# Patient Record
Sex: Male | Born: 1950 | Race: White | Hispanic: Yes | Marital: Married | Smoking: Former smoker
Health system: Southern US, Community
[De-identification: ages and names within clinical notes are randomized; demographics above are authoritative.]

---

## 2011-04-02 ENCOUNTER — Ambulatory Visit: Payer: Self-pay

## 2011-07-02 ENCOUNTER — Ambulatory Visit (INDEPENDENT_AMBULATORY_CARE_PROVIDER_SITE_OTHER): Payer: BC Managed Care – PPO | Admitting: Family Medicine

## 2011-07-02 VITALS — BP 131/85 | HR 59 | Temp 98.1°F | Resp 16 | Ht 67.5 in | Wt 204.8 lb

## 2011-07-02 DIAGNOSIS — Z0289 Encounter for other administrative examinations: Secondary | ICD-10-CM

## 2011-07-02 NOTE — Progress Notes (Signed)
  Subjective:    Patient ID: Isaiah Stevenson, male    DOB: 1950-07-02, 61 y.o.   MRN: [id number]  HPI 61 yo male with HTN here for DOT exam.  Last here 04/02/11 for DOT.  Only give 3 month card due to HTN.  Since that time, saw PCP who increased his metoprolol to twice daily.   Doing well otherwise.    Review of Systems Negative except as per HPI     Objective:   Physical Exam  Constitutional: Vital signs are normal. He appears well-developed and well-nourished. He is active.  HENT:  Head: Normocephalic.  Right Ear: Hearing, tympanic membrane, external ear and ear canal normal.  Left Ear: Hearing, tympanic membrane, external ear and ear canal normal.  Nose: Nose normal.  Mouth/Throat: Uvula is midline, oropharynx is clear and moist and mucous membranes are normal.  Eyes: Conjunctivae and EOM are normal. Pupils are equal, round, and reactive to light.  Neck: No mass and no thyromegaly present.  Cardiovascular: Normal rate, regular rhythm, normal heart sounds, intact distal pulses and normal pulses.   Pulmonary/Chest: Effort normal and breath sounds normal.  Abdominal: Soft. Normal appearance and bowel sounds are normal. There is no hepatosplenomegaly. There is no tenderness. There is no CVA tenderness. No hernia. Hernia confirmed negative in the right inguinal area and confirmed negative in the left inguinal area.  Genitourinary: Testes normal and penis normal.  Lymphadenopathy:    He has no cervical adenopathy.    He has no axillary adenopathy.  Neurological: He is alert.          Assessment & Plan:  1 yr DOT due to HTN

## 2011-10-09 ENCOUNTER — Ambulatory Visit (INDEPENDENT_AMBULATORY_CARE_PROVIDER_SITE_OTHER): Payer: BC Managed Care – PPO | Admitting: Family Medicine

## 2011-10-09 VITALS — BP 120/70 | HR 77 | Temp 98.2°F | Resp 16 | Ht 68.0 in | Wt 210.2 lb

## 2011-10-09 DIAGNOSIS — H9190 Unspecified hearing loss, unspecified ear: Secondary | ICD-10-CM

## 2011-10-09 NOTE — Progress Notes (Signed)
[facility] [address], [place] [address] [phone]  Date:  10/09/2011   Name:  Isaiah Stevenson   DOB:  January 10, 1951   MRN:  [id number]  PCP:  No primary provider on file.    Chief Complaint: Hearing Problem   History of Present Illness:  Isaiah Stevenson is a 61 y.o. very pleasant male patient who presents with the following:  He needs a hearing test today- he needs to get new hearing aids.  He needs an audiogram.  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 He has a current set of aids but needs new ones. He is able to send his audiogram results to a company in [place] and they send him hearing aids at an affordable price.    There is no problem list on file for this patient.   No past medical history on file.  No past surgical history on file.  History  Substance Use Topics  . Smoking status: Former Smoker    Quit date: 07/01/2008  . Smokeless tobacco: Not on file  . Alcohol Use: Not on file    No family history on file.  No Known Allergies  Medication list has been reviewed and updated.  Current Outpatient Prescriptions on File Prior to Visit  Medication Sig Dispense Refill  . fenofibrate micronized (LOFIBRA) 200 MG capsule Take 200 mg by mouth daily before breakfast.      . fexofenadine (ALLEGRA) 180 MG tablet Take 180 mg by mouth daily.      . metoprolol (LOPRESSOR) 100 MG tablet Take 100 mg by mouth 2 (two) times daily.      . Multiple Vitamin (MULTIVITAMIN) tablet Take 1 tablet by mouth daily.      Marland Kitchen omeprazole (PRILOSEC) 40 MG capsule Take 40 mg by mouth daily.      . pravastatin (PRAVACHOL) 40 MG tablet Take 40 mg by mouth daily.        Review of Systems:  As per HPI- otherwise negative.  Physical Examination: Filed Vitals:   10/09/11 0841  BP: 120/70  Pulse: 77  Temp: 98.2 F (36.8 C)  Resp: 16   Filed Vitals:   10/09/11 0841  Height: 5\' 8"  (1.727 m)  Weight: 210 lb 3.2 oz (95.346 kg)   Body mass index is  31.96 kg/(m^2). Ideal Body Weight: Weight in (lb) to have BMI = 25: 164.1   GEN: WDWN, NAD, Non-toxic, A & O x 3, overweight HEENT: Atraumatic, Normocephalic. Neck supple. No masses, No LAD.  Tm and oropharynx wnl, PEERL Ears and Nose: No external deformity. CV: RRR, No M/G/R. No JVD. No thrill. No extra heart sounds. PULM: CTA B, no wheezes, crackles, rhonchi. No retractions. No resp. distress. No accessory muscle use. EXTR: No c/c/e NEURO Normal gait.  PSYCH: Normally interactive. Conversant. Not depressed or anxious appearing.  Calm demeanor.  Audiometry: moderate to moderately severe bilateral hearing loss  Assessment and Plan: 1. Hearing loss  PR COMPREHENSIVE HEARING TEST   Did audiogram and gave him results so he can update his hearing aids.  He will let us know if we can do anything else to help.    Abbe Amsterdam, MD

## 2017-12-24 ENCOUNTER — Inpatient Hospital Stay
Admit: 2017-12-24 | Discharge: 2018-01-21 | Disposition: E | Payer: Self-pay | Source: Ambulatory Visit | Attending: Internal Medicine | Admitting: Internal Medicine

## 2017-12-24 DIAGNOSIS — R17 Unspecified jaundice: Secondary | ICD-10-CM

## 2017-12-24 DIAGNOSIS — R0902 Hypoxemia: Secondary | ICD-10-CM

## 2017-12-24 DIAGNOSIS — Z4659 Encounter for fitting and adjustment of other gastrointestinal appliance and device: Secondary | ICD-10-CM

## 2017-12-24 DIAGNOSIS — I509 Heart failure, unspecified: Secondary | ICD-10-CM

## 2017-12-24 DIAGNOSIS — J189 Pneumonia, unspecified organism: Secondary | ICD-10-CM

## 2017-12-24 LAB — CREATININE, SERUM
CREATININE: 2.89 mg/dL — AB (ref 0.61–1.24)
GFR calc Af Amer: 25 mL/min — ABNORMAL LOW (ref >60)
GFR, EST NON AFRICAN AMERICAN: 21 mL/min — AB (ref >60)

## 2017-12-24 LAB — CBC WITH DIFFERENTIAL/PLATELET
Basophils Absolute: 0 10*3/uL (ref 0.0–0.1)
Basophils Relative: 0 %
EOS ABS: 0.1 10*3/uL (ref 0.0–0.7)
EOS PCT: 1 %
HCT: 26.2 % — ABNORMAL LOW (ref 39.0–52.0)
Hemoglobin: 8 g/dL — ABNORMAL LOW (ref 13.0–17.0)
LYMPHS ABS: 1.3 10*3/uL (ref 0.7–4.0)
Lymphocytes Relative: 9 %
MCH: 34.5 pg — AB (ref 26.0–34.0)
MCHC: 30.5 g/dL (ref 30.0–36.0)
MCV: 112.9 fL — AB (ref 78.0–100.0)
MONO ABS: 1 10*3/uL (ref 0.1–1.0)
Monocytes Relative: 7 %
Neutro Abs: 12.2 10*3/uL — ABNORMAL HIGH (ref 1.7–7.7)
Neutrophils Relative %: 83 %
PLATELETS: UNDETERMINED 10*3/uL (ref 150–400)
RBC: 2.32 MIL/uL — AB (ref 4.22–5.81)
RDW: 24.5 % — AB (ref 11.5–15.5)
WBC: 14.6 10*3/uL — AB (ref 4.0–10.5)

## 2017-12-24 LAB — PROTIME-INR
INR: 4.28
Prothrombin Time: 40.8 seconds — ABNORMAL HIGH (ref 11.4–15.2)

## 2017-12-24 LAB — APTT: APTT: 85 s — AB (ref 24–36)

## 2017-12-25 ENCOUNTER — Other Ambulatory Visit (HOSPITAL_COMMUNITY): Payer: Self-pay

## 2017-12-25 LAB — CBC WITH DIFFERENTIAL/PLATELET
BASOS PCT: 0 %
Basophils Absolute: 0 10*3/uL (ref 0.0–0.1)
EOS ABS: 0.2 10*3/uL (ref 0.0–0.7)
Eosinophils Relative: 1 %
HCT: 26.3 % — ABNORMAL LOW (ref 39.0–52.0)
Hemoglobin: 7.9 g/dL — ABNORMAL LOW (ref 13.0–17.0)
Lymphocytes Relative: 9 %
Lymphs Abs: 1.4 10*3/uL (ref 0.7–4.0)
MCH: 34.5 pg — AB (ref 26.0–34.0)
MCHC: 30 g/dL (ref 30.0–36.0)
MCV: 114.8 fL — ABNORMAL HIGH (ref 78.0–100.0)
MONO ABS: 1.4 10*3/uL — AB (ref 0.1–1.0)
Monocytes Relative: 9 %
NEUTROS PCT: 81 %
Neutro Abs: 13.1 10*3/uL — ABNORMAL HIGH (ref 1.7–7.7)
PLATELETS: 132 10*3/uL — AB (ref 150–400)
RBC: 2.29 MIL/uL — ABNORMAL LOW (ref 4.22–5.81)
RDW: 23.5 % — ABNORMAL HIGH (ref 11.5–15.5)
WBC: 16.1 10*3/uL — ABNORMAL HIGH (ref 4.0–10.5)

## 2017-12-25 LAB — COMPREHENSIVE METABOLIC PANEL
ALBUMIN: 2 g/dL — AB (ref 3.5–5.0)
ALT: 23 U/L (ref 0–44)
AST: 174 U/L — ABNORMAL HIGH (ref 15–41)
Alkaline Phosphatase: 107 U/L (ref 38–126)
Anion gap: 14 (ref 5–15)
BUN: 43 mg/dL — ABNORMAL HIGH (ref 8–23)
CHLORIDE: 96 mmol/L — AB (ref 98–111)
CO2: 20 mmol/L — ABNORMAL LOW (ref 22–32)
Calcium: 7.9 mg/dL — ABNORMAL LOW (ref 8.9–10.3)
Creatinine, Ser: 3.54 mg/dL — ABNORMAL HIGH (ref 0.61–1.24)
GFR calc Af Amer: 19 mL/min — ABNORMAL LOW (ref >60)
GFR calc non Af Amer: 17 mL/min — ABNORMAL LOW (ref >60)
GLUCOSE: 108 mg/dL — AB (ref 70–99)
POTASSIUM: 4 mmol/L (ref 3.5–5.1)
Sodium: 130 mmol/L — ABNORMAL LOW (ref 135–145)
TOTAL PROTEIN: 4.9 g/dL — AB (ref 6.5–8.1)
Total Bilirubin: 7.5 mg/dL — ABNORMAL HIGH (ref 0.3–1.2)

## 2017-12-25 LAB — APTT
APTT: 111 s — AB (ref 24–36)
APTT: 101 s — AB (ref 24–36)
aPTT: 110 seconds — ABNORMAL HIGH (ref 24–36)
aPTT: 97 seconds — ABNORMAL HIGH (ref 24–36)

## 2017-12-25 LAB — PROTIME-INR
INR: 7.22 — AB
INR: 7.43 — AB
PROTHROMBIN TIME: 61.4 s — AB (ref 11.4–15.2)
Prothrombin Time: 62.7 seconds — ABNORMAL HIGH (ref 11.4–15.2)

## 2017-12-26 LAB — APTT
APTT: 99 s — AB (ref 24–36)
APTT: 104 s — AB (ref 24–36)
aPTT: 96 seconds — ABNORMAL HIGH (ref 24–36)

## 2017-12-26 LAB — CBC WITH DIFFERENTIAL/PLATELET
BASOS ABS: 0 10*3/uL (ref 0.0–0.1)
Basophils Relative: 0 %
EOS ABS: 0.4 10*3/uL (ref 0.0–0.7)
Eosinophils Relative: 2 %
HCT: 26.2 % — ABNORMAL LOW (ref 39.0–52.0)
Hemoglobin: 7.8 g/dL — ABNORMAL LOW (ref 13.0–17.0)
Lymphocytes Relative: 11 %
Lymphs Abs: 2 10*3/uL (ref 0.7–4.0)
MCH: 33.1 pg (ref 26.0–34.0)
MCHC: 29.8 g/dL — AB (ref 30.0–36.0)
MCV: 111 fL — ABNORMAL HIGH (ref 78.0–100.0)
Monocytes Absolute: 1.7 10*3/uL — ABNORMAL HIGH (ref 0.1–1.0)
Monocytes Relative: 9 %
NEUTROS ABS: 14.3 10*3/uL — AB (ref 1.7–7.7)
Neutrophils Relative %: 78 %
Platelets: 156 10*3/uL (ref 150–400)
RBC: 2.36 MIL/uL — ABNORMAL LOW (ref 4.22–5.81)
RDW: 23.9 % — ABNORMAL HIGH (ref 11.5–15.5)
WBC: 18.4 10*3/uL — ABNORMAL HIGH (ref 4.0–10.5)

## 2017-12-26 LAB — PROTIME-INR
INR: 5.22
Prothrombin Time: 47.6 seconds — ABNORMAL HIGH (ref 11.4–15.2)

## 2017-12-27 ENCOUNTER — Other Ambulatory Visit (HOSPITAL_COMMUNITY): Payer: Self-pay

## 2017-12-27 LAB — CBC WITH DIFFERENTIAL/PLATELET
BASOS PCT: 1 %
Basophils Absolute: 0.2 10*3/uL — ABNORMAL HIGH (ref 0.0–0.1)
EOS ABS: 0.2 10*3/uL (ref 0.0–0.7)
EOS PCT: 1 %
HEMATOCRIT: 26.5 % — AB (ref 39.0–52.0)
HEMOGLOBIN: 8.1 g/dL — AB (ref 13.0–17.0)
LYMPHS PCT: 7 %
Lymphs Abs: 1.4 10*3/uL (ref 0.7–4.0)
MCH: 33.9 pg (ref 26.0–34.0)
MCHC: 30.6 g/dL (ref 30.0–36.0)
MCV: 110.9 fL — AB (ref 78.0–100.0)
Monocytes Absolute: 0.8 10*3/uL (ref 0.1–1.0)
Monocytes Relative: 4 %
NEUTROS PCT: 87 %
Neutro Abs: 17 10*3/uL — ABNORMAL HIGH (ref 1.7–7.7)
Platelets: 159 10*3/uL (ref 150–400)
RBC: 2.39 MIL/uL — ABNORMAL LOW (ref 4.22–5.81)
RDW: 24.5 % — ABNORMAL HIGH (ref 11.5–15.5)
WBC: 19.6 10*3/uL — ABNORMAL HIGH (ref 4.0–10.5)

## 2017-12-27 LAB — PROTIME-INR
INR: 5.68 — AB
Prothrombin Time: 50.9 seconds — ABNORMAL HIGH (ref 11.4–15.2)

## 2017-12-27 LAB — RENAL FUNCTION PANEL
Albumin: 2 g/dL — ABNORMAL LOW (ref 3.5–5.0)
Anion gap: 18 — ABNORMAL HIGH (ref 5–15)
BUN: 71 mg/dL — AB (ref 8–23)
CHLORIDE: 94 mmol/L — AB (ref 98–111)
CO2: 16 mmol/L — ABNORMAL LOW (ref 22–32)
CREATININE: 4.99 mg/dL — AB (ref 0.61–1.24)
Calcium: 8.3 mg/dL — ABNORMAL LOW (ref 8.9–10.3)
GFR calc Af Amer: 13 mL/min — ABNORMAL LOW (ref >60)
GFR, EST NON AFRICAN AMERICAN: 11 mL/min — AB (ref >60)
Glucose, Bld: 75 mg/dL (ref 70–99)
Phosphorus: 9.2 mg/dL — ABNORMAL HIGH (ref 2.5–4.6)
Potassium: 5.1 mmol/L (ref 3.5–5.1)
Sodium: 128 mmol/L — ABNORMAL LOW (ref 135–145)

## 2017-12-27 LAB — APTT
APTT: 86 s — AB (ref 24–36)
APTT: 88 s — AB (ref 24–36)
APTT: 97 s — AB (ref 24–36)
aPTT: 105 seconds — ABNORMAL HIGH (ref 24–36)

## 2017-12-27 LAB — HEPATIC FUNCTION PANEL
ALK PHOS: 118 U/L (ref 38–126)
ALT: 26 U/L (ref 0–44)
AST: 289 U/L — ABNORMAL HIGH (ref 15–41)
Albumin: 2.1 g/dL — ABNORMAL LOW (ref 3.5–5.0)
BILIRUBIN INDIRECT: 4.7 mg/dL — AB (ref 0.3–0.9)
BILIRUBIN TOTAL: 10.7 mg/dL — AB (ref 0.3–1.2)
Bilirubin, Direct: 6 mg/dL — ABNORMAL HIGH (ref 0.0–0.2)
TOTAL PROTEIN: 5.1 g/dL — AB (ref 6.5–8.1)

## 2017-12-27 LAB — CBC
HCT: 23.4 % — ABNORMAL LOW (ref 39.0–52.0)
Hemoglobin: 7.3 g/dL — ABNORMAL LOW (ref 13.0–17.0)
MCH: 34 pg (ref 26.0–34.0)
MCHC: 31.2 g/dL (ref 30.0–36.0)
MCV: 108.8 fL — AB (ref 78.0–100.0)
PLATELETS: 262 10*3/uL (ref 150–400)
RBC: 2.15 MIL/uL — ABNORMAL LOW (ref 4.22–5.81)
RDW: 23.9 % — AB (ref 11.5–15.5)
WBC: 20.2 10*3/uL — AB (ref 4.0–10.5)

## 2017-12-27 LAB — AMYLASE: AMYLASE: 501 U/L — AB (ref 28–100)

## 2017-12-27 LAB — LIPASE, BLOOD: Lipase: 1256 U/L — ABNORMAL HIGH (ref 11–51)

## 2017-12-27 MED ORDER — WARFARIN SODIUM 3 MG PO TABS
3.00 | ORAL_TABLET | ORAL | Status: DC
Start: 2017-12-25 — End: 2017-12-27

## 2017-12-27 MED ORDER — OXYCODONE HCL 5 MG PO TABS
2.50 | ORAL_TABLET | ORAL | Status: DC
Start: ? — End: 2017-12-27

## 2017-12-27 MED ORDER — ASPIRIN 81 MG PO CHEW
81.00 | CHEWABLE_TABLET | ORAL | Status: DC
Start: 2017-12-25 — End: 2017-12-27

## 2017-12-27 MED ORDER — SODIUM CITRATE 4 % VI SOSY
5.00 | PREFILLED_SYRINGE | Status: DC
Start: ? — End: 2017-12-27

## 2017-12-27 MED ORDER — GENERIC EXTERNAL MEDICATION
0.05 | Status: DC
Start: ? — End: 2017-12-27

## 2017-12-27 MED ORDER — NEPHRO-VITE 0.8 MG PO TABS
1.00 | ORAL_TABLET | ORAL | Status: DC
Start: 2017-12-25 — End: 2017-12-27

## 2017-12-27 MED ORDER — OXIDIZED CELLULOSE EX PADS
1.00 | MEDICATED_PAD | CUTANEOUS | Status: DC
Start: ? — End: 2017-12-27

## 2017-12-27 MED ORDER — LORAZEPAM 0.5 MG PO TABS
0.25 | ORAL_TABLET | ORAL | Status: DC
Start: ? — End: 2017-12-27

## 2017-12-27 MED ORDER — GENERIC EXTERNAL MEDICATION
80.00 | Status: DC
Start: ? — End: 2017-12-27

## 2017-12-27 MED ORDER — THIAMINE HCL 100 MG PO TABS
100.00 | ORAL_TABLET | ORAL | Status: DC
Start: 2017-12-25 — End: 2017-12-27

## 2017-12-27 MED ORDER — ONDANSETRON HCL 4 MG/2ML IJ SOLN
4.00 | INTRAMUSCULAR | Status: DC
Start: ? — End: 2017-12-27

## 2017-12-27 MED ORDER — DRONABINOL 2.5 MG PO CAPS
10.00 | ORAL_CAPSULE | ORAL | Status: DC
Start: 2017-12-24 — End: 2017-12-27

## 2017-12-27 MED ORDER — FOLIC ACID 1 MG PO TABS
5.00 | ORAL_TABLET | ORAL | Status: DC
Start: 2017-12-25 — End: 2017-12-27

## 2017-12-27 MED ORDER — PANTOPRAZOLE SODIUM 40 MG PO TBEC
40.00 | DELAYED_RELEASE_TABLET | ORAL | Status: DC
Start: 2017-12-25 — End: 2017-12-27

## 2017-12-27 NOTE — Consult Note (Signed)
[facility] CONSULT NOTE    Date: 12/27/2017                  Patient Name:  Isaiah Stevenson  MRN: [id number]  DOB: 1950/04/05  Age / Sex: 67 y.o., male         PCP: Regino Bellow, MD                 Service Requesting Consult: Hospitalist                 Reason for Consult: Management of Acute renal failure requiring dialysis            History of Present Illness: Patient is a 67 y.o. male with a PMHx of coronary artery disease status post CABG, VSD repair x2 most recently on December 02, 2017, heparin-induced thrombocytopenia, alcohol abuse, acute kidney injury requiring dialysis, hyperlipidemia, who was admitted to [facility] on 01/02/2018 for ongoing rehabilitation.  Patient originally presented to [facility] with ST elevation myocardial infarction and underwent CABG x1 with VSD repair on August 12.  He was then discharged but came back with worsening dyspnea and edema.  Repeat TEE demonstrated persistent VSD.  He then had VSD closure on December 02, 2017.  He developed transaminitis as well as acute kidney injury.  His baseline creatinine is 1.0.  He has a right internal jugular PermCath in place.  Initially he was on CRRT and then transition to intermittent hemodialysis late in the course of his prior hospitalization.  He remains dialysis dependent at the moment.  BUN is currently 43 with a creatinine of 3.54.  He is also anemic with a hemoglobin of 8.1.  Wife is at the bedside and offered history.   Medications: Outpatient medications: Medications Prior to Admission  Medication Sig Dispense Refill Last Dose  . fenofibrate micronized (LOFIBRA) 200 MG capsule Take 200 mg by mouth daily before breakfast.   Taking  . fexofenadine (ALLEGRA) 180 MG tablet Take 180 mg by mouth daily.   Taking  . metoprolol (LOPRESSOR) 100 MG tablet Take 100 mg by mouth 2 (two) times daily.   Taking  . Multiple Vitamin (MULTIVITAMIN) tablet Take 1  tablet by mouth daily.   Taking  . omeprazole (PRILOSEC) 40 MG capsule Take 40 mg by mouth daily.   Taking  . pravastatin (PRAVACHOL) 40 MG tablet Take 40 mg by mouth daily.   Taking    Current medications: No current facility-administered medications for this encounter.       Allergies: No Known Allergies    Past Medical History: coronary artery disease status post CABG, VSD repair x2 most recently on December 04, 2017, heparin-induced thrombocytopenia, alcohol abuse, acute kidney injury requiring dialysis, hyperlipidemia  Past Surgical History: CABG times 18/12/19, VSD repair x 2 Permcath placement. Appendectomy Tonsillectomy  Family History: Father had history of prostate cancer  Social History: Social History   Socioeconomic History  . Marital status: Married    Spouse name: Not on file  . Number of children: Not on file  . Years of education: Not on file  . Highest education level: Not on file  Occupational History  . Not on file  Social Needs  . Financial resource strain: Not on file  . Food insecurity:    Worry: Not on file    Inability: Not on file  . Transportation needs:    Medical: Not on file    Non-medical: Not on file  Tobacco Use  . Smoking status: Former Smoker    Last attempt to quit: 07/01/2008    Years since quitting: 9.4  Substance and Sexual Activity  . Alcohol use: Not on file  . Drug use: Not on file  . Sexual activity: Not on file  Lifestyle  . Physical activity:    Days per week: Not on file    Minutes per session: Not on file  . Stress: Not on file  Relationships  . Social connections:    Talks on phone: Not on file    Gets together: Not on file    Attends religious service: Not on file    Active member of club or organization: Not on file    Attends meetings of clubs or organizations: Not on file    Relationship status: Not on file  . Intimate partner violence:    Fear of current or ex partner: Not on file    Emotionally  abused: Not on file    Physically abused: Not on file    Forced sexual activity: Not on file  Other Topics Concern  . Not on file  Social History Narrative  . Not on file     Review of Systems: Patient unable to concentrate on ROS questions  Vital Signs: Temperature 97.8 pulse 107 respirations 18 blood pressure 114/79 Weight trends: There were no vitals filed for this visit.  Physical Exam: General: NAD, resting in bed  Head: Normocephalic, atraumatic.  Eyes: Mild iceterus  Nose: Mucous membranes moist, not inflammed, nonerythematous.  Throat: Oropharynx nonerythematous, no exudate appreciated.   Neck: Supple, trachea midline.  Lungs:  Normal respiratory effort. Scattered rhonchi  Heart: S1S2 2/6 SEM  Abdomen:  BS normoactive. Soft, Nondistended, non-tender.  No masses or organomegaly.  Extremities: trace pretibial edema.  Neurologic: Lethargic but arousable  Skin: Midline chest scar    Lab results: Basic Metabolic Panel: Recent Labs  Lab Jan 11, 2018 2204 12/25/17 1305  NA  --  130*  K  --  4.0  CL  --  96*  CO2  --  20*  GLUCOSE  --  108*  BUN  --  43*  CREATININE 2.89* 3.54*  CALCIUM  --  7.9*    Liver Function Tests: Recent Labs  Lab 12/25/17 1305  AST 174*  ALT 23  ALKPHOS 107  BILITOT 7.5*  PROT 4.9*  ALBUMIN 2.0*   No results for input(s): LIPASE, AMYLASE in the last 168 hours. No results for input(s): AMMONIA in the last 168 hours.  CBC: Recent Labs  Lab 11-Jan-2018 2204 12/25/17 0408 12/26/17 0716 12/27/17 0255  WBC 14.6* 16.1* 18.4* 19.6*  NEUTROABS 12.2* 13.1* 14.3* 17.0*  HGB 8.0* 7.9* 7.8* 8.1*  HCT 26.2* 26.3* 26.2* 26.5*  MCV 112.9* 114.8* 111.0* 110.9*  PLT PLATELET CLUMPS NOTED ON SMEAR, UNABLE TO ESTIMATE 132* 156 159    Cardiac Enzymes: No results for input(s): CKTOTAL, CKMB, CKMBINDEX, TROPONINI in the last 168 hours.  BNP: Invalid input(s): POCBNP  CBG: No results for input(s): GLUCAP in the last 168  hours.  Microbiology: No results found for this or any previous visit.  Coagulation Studies: Recent Labs    12/25/17 1305 12/26/17 0716 12/27/17 0255  LABPROT 62.7* 47.6* 50.9*  INR 7.43* 5.22* 5.68*    Urinalysis: No results for input(s): COLORURINE, LABSPEC, PHURINE, GLUCOSEU, HGBUR, BILIRUBINUR, KETONESUR, PROTEINUR, UROBILINOGEN, NITRITE, LEUKOCYTESUR in the last 72 hours.  Invalid input(s): APPERANCEUR    Imaging: Dg Chest Port 1 View  Result  Date: 12/25/2017 CLINICAL DATA:  CHF EXAM: PORTABLE CHEST 1 VIEW COMPARISON:  11/01/2017 FINDINGS: Right dialysis catheter in place with the tip in the right atrium. No pneumothorax. Prior median sternotomy. Cardiomegaly. Bilateral lower lobe airspace opacities, left greater than right. Small left effusion. Mild vascular congestion. IMPRESSION: Cardiomegaly, vascular congestion. Small left effusion with bilateral lower lobe atelectasis or infiltrates. Findings could reflect edema or pneumonia. Electronically Signed   By: Charlett Nose M.D.   On: 12/25/2017 13:19      Assessment & Plan: Pt is a 67 y.o. male with past medical history coronary artery disease status post CABG x 1, VSD repair x2 most recently on December 04, 2017, heparin-induced thrombocytopenia, alcohol abuse, acute kidney injury requiring dialysis, hyperlipidemia who presents [facility] for [facility] care.   1.  Acute renal failure requiring dialysis. 2.  Heparin-induced thrombocytopenia. 3.  Hyponatremia. 4.  Anemia unspecified. 5.  Status post CABG x1 and VSD repair x2.  Plan: We are asked to see the patient for evaluation management of acute renal failure that has recently required dialysis.  Current BUN is 43 with a creatinine of 3.54.  We will plan for dialysis today as well as on Wednesday.  We will continue to monitor his urine output.  Hopefully over the course of the hospitalization he will recover kidney function.  Packed the patient's dialysis catheter with  citrate and avoid heparin given history of heparin-induced thrombocytopenia.  Further plan as patient progresses.

## 2017-12-28 LAB — CBC WITH DIFFERENTIAL/PLATELET
Basophils Absolute: 0 10*3/uL (ref 0.0–0.1)
Basophils Relative: 0 %
EOS PCT: 0 %
Eosinophils Absolute: 0 10*3/uL (ref 0.0–0.5)
HEMATOCRIT: 23.2 % — AB (ref 39.0–52.0)
Hemoglobin: 7.2 g/dL — ABNORMAL LOW (ref 13.0–17.0)
LYMPHS ABS: 1.1 10*3/uL (ref 0.7–4.0)
Lymphocytes Relative: 4 %
MCH: 34 pg (ref 26.0–34.0)
MCHC: 31 g/dL (ref 30.0–36.0)
MCV: 109.4 fL — AB (ref 80.0–100.0)
Monocytes Absolute: 1.3 10*3/uL — ABNORMAL HIGH (ref 0.1–1.0)
Monocytes Relative: 5 %
Neutro Abs: 24.2 10*3/uL — ABNORMAL HIGH (ref 1.7–7.7)
Neutrophils Relative %: 91 %
PLATELETS: 235 10*3/uL (ref 150–400)
RBC: 2.12 MIL/uL — AB (ref 4.22–5.81)
RDW: 25.1 % — AB (ref 11.5–15.5)
WBC: 26.6 10*3/uL — AB (ref 4.0–10.5)

## 2017-12-28 LAB — PROTIME-INR
INR: 8.15 — AB
INR: 6.7
Prothrombin Time: 67.5 seconds — ABNORMAL HIGH (ref 11.4–15.2)
Prothrombin Time: 57.9 seconds — ABNORMAL HIGH (ref 11.4–15.2)

## 2017-12-28 LAB — APTT
APTT: 52 s — AB (ref 24–36)
aPTT: 101 seconds — ABNORMAL HIGH (ref 24–36)

## 2017-12-28 LAB — HEPATITIS B SURFACE ANTIGEN: HEP B S AG: NEGATIVE

## 2017-12-28 LAB — HEPATITIS B SURFACE ANTIBODY,QUALITATIVE: Hep B S Ab: REACTIVE

## 2017-12-28 LAB — HEPATITIS B CORE ANTIBODY, TOTAL: HEP B C TOTAL AB: NEGATIVE

## 2017-12-29 ENCOUNTER — Other Ambulatory Visit (HOSPITAL_COMMUNITY): Payer: Self-pay

## 2017-12-29 LAB — CBC
HCT: 29.2 % — ABNORMAL LOW (ref 39.0–52.0)
Hemoglobin: 8.8 g/dL — ABNORMAL LOW (ref 13.0–17.0)
MCH: 30.9 pg (ref 26.0–34.0)
MCHC: 30.1 g/dL (ref 30.0–36.0)
MCV: 102.5 fL — AB (ref 80.0–100.0)
NRBC: 0.3 % — AB (ref 0.0–0.2)
PLATELETS: 108 10*3/uL — AB (ref 150–400)
RBC: 2.85 MIL/uL — ABNORMAL LOW (ref 4.22–5.81)
RDW: 28.2 % — AB (ref 11.5–15.5)
WBC: 25.9 10*3/uL — ABNORMAL HIGH (ref 4.0–10.5)

## 2017-12-29 LAB — RENAL FUNCTION PANEL
Albumin: 1.9 g/dL — ABNORMAL LOW (ref 3.5–5.0)
Anion gap: 11 (ref 5–15)
BUN: 42 mg/dL — ABNORMAL HIGH (ref 8–23)
CHLORIDE: 97 mmol/L — AB (ref 98–111)
CO2: 23 mmol/L (ref 22–32)
CREATININE: 3.77 mg/dL — AB (ref 0.61–1.24)
Calcium: 7.4 mg/dL — ABNORMAL LOW (ref 8.9–10.3)
GFR, EST AFRICAN AMERICAN: 18 mL/min — AB (ref >60)
GFR, EST NON AFRICAN AMERICAN: 15 mL/min — AB (ref >60)
Glucose, Bld: 98 mg/dL (ref 70–99)
PHOSPHORUS: 6 mg/dL — AB (ref 2.5–4.6)
Potassium: 4.1 mmol/L (ref 3.5–5.1)
Sodium: 131 mmol/L — ABNORMAL LOW (ref 135–145)

## 2017-12-29 LAB — APTT
aPTT: 75 seconds — ABNORMAL HIGH (ref 24–36)
aPTT: 59 seconds — ABNORMAL HIGH (ref 24–36)
aPTT: 97 seconds — ABNORMAL HIGH (ref 24–36)

## 2017-12-29 LAB — CBC WITH DIFFERENTIAL/PLATELET
Abs Immature Granulocytes: 0.35 10*3/uL — ABNORMAL HIGH (ref 0.00–0.07)
BASOS ABS: 0 10*3/uL (ref 0.0–0.1)
Basophils Relative: 0 %
EOS ABS: 0.2 10*3/uL (ref 0.0–0.5)
EOS PCT: 1 %
HEMATOCRIT: 21.7 % — AB (ref 39.0–52.0)
Hemoglobin: 6.5 g/dL — CL (ref 13.0–17.0)
Immature Granulocytes: 1 %
LYMPHS ABS: 0.8 10*3/uL (ref 0.7–4.0)
LYMPHS PCT: 3 %
MCH: 33 pg (ref 26.0–34.0)
MCHC: 30 g/dL (ref 30.0–36.0)
MCV: 110.2 fL — AB (ref 80.0–100.0)
Monocytes Absolute: 0.3 10*3/uL (ref 0.1–1.0)
Monocytes Relative: 1 %
NEUTROS PCT: 94 %
NRBC: 0.3 % — AB (ref 0.0–0.2)
Neutro Abs: 25.6 10*3/uL — ABNORMAL HIGH (ref 1.7–7.7)
Platelets: 119 10*3/uL — ABNORMAL LOW (ref 150–400)
RBC: 1.97 MIL/uL — AB (ref 4.22–5.81)
RDW: 25.2 % — AB (ref 11.5–15.5)
WBC: 27.3 10*3/uL — ABNORMAL HIGH (ref 4.0–10.5)

## 2017-12-29 LAB — SEDIMENTATION RATE: SED RATE: 23 mm/h — AB (ref 0–16)

## 2017-12-29 LAB — PROTIME-INR
INR: 5.26
Prothrombin Time: 47.9 seconds — ABNORMAL HIGH (ref 11.4–15.2)

## 2017-12-29 LAB — LIPASE, BLOOD: LIPASE: 204 U/L — AB (ref 11–51)

## 2017-12-29 LAB — LACTIC ACID, PLASMA
Lactic Acid, Venous: 1.5 mmol/L (ref 0.5–1.9)
Lactic Acid, Venous: 1.4 mmol/L (ref 0.5–1.9)

## 2017-12-29 LAB — ABO/RH: ABO/RH(D): O POS

## 2017-12-29 LAB — PREPARE RBC (CROSSMATCH)

## 2017-12-29 NOTE — Progress Notes (Signed)
[facility]  ROUNDING NOTE   Subjective:  Patient due for another dialysis session today. Still has significant icterus.  Resting comfortably in bed at the moment.    Objective:  Vital signs in last 24 hours:  Temperature 98 Pulse 102 Respirations 19 Blood pressure 130/70  Physical Exam: General: No acute distress  Head: Normocephalic, atraumatic. Moist oral mucosal membranes  Eyes: Anicteric  Neck: Supple, trachea midline  Lungs:  Scattered rhonchi, normal effort  Heart: S1S2 no rubs  Abdomen:  Soft, nontender, bowel sounds present  Extremities: 1+ peripheral edema.  Neurologic: Awake, alert, following commands  Skin: No lesions  Access: Right IJ permcath    Basic Metabolic Panel: Recent Labs  Lab 12/26/2017 2204 12/25/17 1305 12/27/17 0940 12/29/17 0629  NA  --  130* 128* 131*  K  --  4.0 5.1 4.1  CL  --  96* 94* 97*  CO2  --  20* 16* 23  GLUCOSE  --  108* 75 98  BUN  --  43* 71* 42*  CREATININE 2.89* 3.54* 4.99* 3.77*  CALCIUM  --  7.9* 8.3* 7.4*  PHOS  --   --  9.2* 6.0*    Liver Function Tests: Recent Labs  Lab 12/25/17 1305 12/27/17 0940 12/27/17 1447 12/29/17 0629  AST 174*  --  289*  --   ALT 23  --  26  --   ALKPHOS 107  --  118  --   BILITOT 7.5*  --  10.7*  --   PROT 4.9*  --  5.1*  --   ALBUMIN 2.0* 2.0* 2.1* 1.9*   Recent Labs  Lab 12/27/17 1447 12/29/17 0629  LIPASE 1,256* 204*  AMYLASE 501*  --    No results for input(s): AMMONIA in the last 168 hours.  CBC: Recent Labs  Lab 12/25/17 0408 12/26/17 0716 12/27/17 0255 12/27/17 0940 12/28/17 0349 12/29/17 0629  WBC 16.1* 18.4* 19.6* 20.2* 26.6* 27.3*  NEUTROABS 13.1* 14.3* 17.0*  --  24.2* 25.6*  HGB 7.9* 7.8* 8.1* 7.3* 7.2* 6.5*  HCT 26.3* 26.2* 26.5* 23.4* 23.2* 21.7*  MCV 114.8* 111.0* 110.9* 108.8* 109.4* 110.2*  PLT 132* 156 159 262 235 119*    Cardiac Enzymes: No results for input(s): CKTOTAL, CKMB, CKMBINDEX, TROPONINI in the last 168  hours.  BNP: Invalid input(s): POCBNP  CBG: No results for input(s): GLUCAP in the last 168 hours.  Microbiology: No results found for this or any previous visit.  Coagulation Studies: Recent Labs    12/27/17 0255 12/28/17 0349 12/28/17 0915 12/29/17 0629  LABPROT 50.9* 67.5* 57.9* 47.9*  INR 5.68* 8.15* 6.70* 5.26*    Urinalysis: No results for input(s): COLORURINE, LABSPEC, PHURINE, GLUCOSEU, HGBUR, BILIRUBINUR, KETONESUR, PROTEINUR, UROBILINOGEN, NITRITE, LEUKOCYTESUR in the last 72 hours.  Invalid input(s): APPERANCEUR    Imaging: US Abdomen Limited  Result Date: 12/27/2017 CLINICAL DATA:  Jaundice EXAM: ULTRASOUND ABDOMEN LIMITED RIGHT UPPER QUADRANT COMPARISON:  None FINDINGS: Gallbladder: Distended gallbladder filled with sludge. No definite shadowing calculi, wall thickening, or pericholecystic fluid. Unable to assess for presence of a sonographic Murphy sign due to prior pain medication. Common bile duct: Diameter: 3 mm diameter, normal Liver: Upper normal echogenicity. No focal mass or nodularity. Portal vein is patent on color Doppler imaging with normal direction of blood flow towards the liver. Minimal perihepatic free fluid. Incidentally noted RIGHT pleural effusion. IMPRESSION: Distended gallbladder filled with sludge but no definite shadowing calculi. Minimal perihepatic ascites and noted RIGHT pleural effusion. Electronically Signed  By: Ulyses Southward M.D.   On: 12/27/2017 18:58     Medications:       Assessment/ Plan:  67 y.o. male with past medical history coronary artery disease status post CABG x 1, VSD repair x2 most recently on December 04, 2017, heparin-induced thrombocytopenia, alcohol abuse, acute kidney injury requiring dialysis, hyperlipidemia who presents to [facility] for ongoing care.   1.  Acute renal failure requiring dialysis. 2.  Heparin-induced thrombocytopenia, catheter packed with saline.  3.  Hyponatremia. 4.  Anemia  unspecified. 5.  Status post CABG x1 and VSD repair x2.  Plan: Patient still has significant acute renal failure requiring dialysis.  As such we have set the patient on for hemodialysis today.  He has history of heparin-induced thrombus cytopenia therefore we will need to continue to pack his catheter with normal saline.  Continues to have mild hyponatremia with a serum sodium of 131.  In addition his hemoglobin is dropped to 6.5 therefore we recommend transfusion but defer this to hospitalist.  We will continue to monitor his progress.   LOS: 0 Manasi Dishon 10/9/201910:59 AM

## 2017-12-30 ENCOUNTER — Other Ambulatory Visit (HOSPITAL_COMMUNITY): Payer: Self-pay

## 2017-12-30 LAB — CBC WITH DIFFERENTIAL/PLATELET
ABS IMMATURE GRANULOCYTES: 0.24 10*3/uL — AB (ref 0.00–0.07)
Basophils Absolute: 0 10*3/uL (ref 0.0–0.1)
Basophils Relative: 0 %
Eosinophils Absolute: 0.1 10*3/uL (ref 0.0–0.5)
Eosinophils Relative: 0 %
HCT: 28 % — ABNORMAL LOW (ref 39.0–52.0)
HEMOGLOBIN: 8.5 g/dL — AB (ref 13.0–17.0)
Immature Granulocytes: 1 %
LYMPHS ABS: 0.7 10*3/uL (ref 0.7–4.0)
LYMPHS PCT: 4 %
MCH: 30.6 pg (ref 26.0–34.0)
MCHC: 30.4 g/dL (ref 30.0–36.0)
MCV: 100.7 fL — ABNORMAL HIGH (ref 80.0–100.0)
MONO ABS: 0.3 10*3/uL (ref 0.1–1.0)
MONOS PCT: 2 %
NEUTROS ABS: 19 10*3/uL — AB (ref 1.7–7.7)
Neutrophils Relative %: 93 %
Platelets: 101 10*3/uL — ABNORMAL LOW (ref 150–400)
RBC: 2.78 MIL/uL — AB (ref 4.22–5.81)
RDW: 28.6 % — ABNORMAL HIGH (ref 11.5–15.5)
WBC: 20.4 10*3/uL — AB (ref 4.0–10.5)
nRBC: 0.6 % — ABNORMAL HIGH (ref 0.0–0.2)

## 2017-12-30 LAB — APTT
aPTT: 97 seconds — ABNORMAL HIGH (ref 24–36)
aPTT: 102 seconds — ABNORMAL HIGH (ref 24–36)

## 2017-12-30 LAB — TYPE AND SCREEN
ABO/RH(D): O POS
Antibody Screen: NEGATIVE
Unit division: 0

## 2017-12-30 LAB — LIPASE, BLOOD: Lipase: 113 U/L — ABNORMAL HIGH (ref 11–51)

## 2017-12-30 LAB — BPAM RBC
Blood Product Expiration Date: 201911052359
ISSUE DATE / TIME: 201910091203
UNIT TYPE AND RH: 5100

## 2017-12-30 LAB — HEPATIC FUNCTION PANEL
ALBUMIN: 1.9 g/dL — AB (ref 3.5–5.0)
ALT: 41 U/L (ref 0–44)
AST: 318 U/L — ABNORMAL HIGH (ref 15–41)
Alkaline Phosphatase: 127 U/L — ABNORMAL HIGH (ref 38–126)
BILIRUBIN DIRECT: 8.3 mg/dL — AB (ref 0.0–0.2)
BILIRUBIN INDIRECT: 5.2 mg/dL — AB (ref 0.3–0.9)
TOTAL PROTEIN: 4.9 g/dL — AB (ref 6.5–8.1)
Total Bilirubin: 13.5 mg/dL — ABNORMAL HIGH (ref 0.3–1.2)

## 2017-12-30 LAB — PROTIME-INR
INR: 4.37 — AB
PROTHROMBIN TIME: 41.4 s — AB (ref 11.4–15.2)

## 2017-12-30 NOTE — Consult Note (Addendum)
[facility] Consult: 4:39 PM 12/30/2017  LOS: 0 days    Referring Provider: Dr Owens Shark  Primary Care Physician:  Kateri Mc, MD Primary Gastroenterologist: Althia Forts [facility] is Dr. Lewanda Rife    Reason for Consultation: Jaundice.   HPI: Isaiah Stevenson is a 67 y.o. male.  PMH CAD.  S/P 1 V CABG and repair of ventricular septal defect 11/01/2017.  Complications included cardiogenic shock requiring IABP, agitation/delirium due to alcohol withdrawal.  Persistent VSD required repeat repair 12/02/2017.  Postsurgical complication of heparin induced thrombocytopenia being treated with Argatrobatran, pulmonary edema, encephalopathy, AKI leading to ESRD and CRRT transitioned to hemo-dialysis, transaminitis/jaundice, hyponatremia.  Required red cell transfusion for anemia. 01/2017 EGD.  Food in stomach and duodenal bulb prevented advancement of the scope suggesting delayed gastric emptying.Marland Kitchen  He had last eaten 10 or 11 hours prior to the procedure. 11/2015 colonoscopy.  Unable to find report in care everywhere Additional surgeries include appendectomy, tonsillectomy.  Abdominal ultrasound 12/09/2017.  Findings included gallbladder sludge but no evidence of cholecystitis.  Liver enlarged with coarse hepatic echotexture.  Antegrade flow in the portal and hepatic venous systems.  Pulsatility of portal vein waveform which can be seen in setting of heart failure.  Normal spleen.  Pancreas not well-visualized.  Transferred to [facility] 01/04/2018.  Remains jaundiced.  Not clear if he is having abdominal pain or not because staff says he groans all the time.  He remains confused.  Abdominal ultrasound 12/27/17:   Distended gallbladder, filled with sludge.  No evidence of cholecystitis.  3 mm CBD.  Upper  limits of normal liver echogenicity.  No nodularity or masses.  Normal flow through the portal vein.  Minimal perihepatic ascites.  Right pleural effusion. MRI/MRCP 12/31/2017.  Normal CBD.  Mild gallbladder distention and gallbladder sludge.  No liver or pancreatic abnormalities   Prior to admission labs Early 11/2017 T bili 0.7 alk phos 165, transaminases 44/66 12/27/2017 T bili 9.2, alkaline phosphatase 130, AST/ALT 182/27 12/23/2017 PT 23.4/INR 2.3 12/22/2017 Hgb 7.1 >> 7.8 on 01/05/2018.  MCV is as high as 113.    Current labs T bili 10.7 >> 13.5 >> 14.3 Alkaline phosphatase 118 >> 127 >> 125 AST/ALT 289/26 >> 318/41 >> 391/44 Hepatitis B surface antigen negative Hepatitis B core antibody total negative HCV antibody in process. Hepatitis B surface antibody reactive. PT 41/INR 4.3. Hgb 8.5, MCV 100.  Platelets 101. INRs elevated 8.15 >> 3.7 in last 4 days    Scheduled Meds: Argatroban drip Ciprofloxacin IV Metronidazole IV Aspirin 81 mg/day Atorvastatin Dronabinol Folic acid p.o. Lidoderm patch Loratadine Nephro-Vite vitamin Probiotic, Risa-BID Protonix 40 mg IV daily Thiamine p.o.     Allergies as of 01/11/2018  . (No Known Allergies)    No family history on file.  Social History   Socioeconomic History  . Marital status: Married    Spouse name: Not on file  . Number of children: Not on file  . Years of education: Not on file  . Highest education level: Not on file  Occupational History  . Not on file  Social Needs  . Financial resource strain: Not on file  . Food insecurity:    Worry: Not on file    Inability: Not on file  . Transportation needs:    Medical: Not on file    Non-medical: Not on file  Tobacco Use  . Smoking status: Former Smoker    Last attempt to quit: 07/01/2008    Years since quitting: 9.5  Substance and Sexual Activity  . Alcohol use: Not on file  . Drug use: Not on file  . Sexual activity: Not on file  Lifestyle  . Physical  activity:    Days per week: Not on file    Minutes per session: Not on file  . Stress: Not on file  Relationships  . Social connections:    Talks on phone: Not on file    Gets together: Not on file    Attends religious service: Not on file    Active member of club or organization: Not on file    Attends meetings of clubs or organizations: Not on file    Relationship status: Not on file  . Intimate partner violence:    Fear of current or ex partner: Not on file    Emotionally abused: Not on file    Physically abused: Not on file    Forced sexual activity: Not on file  Other Topics Concern  . Not on file  Social History Narrative  . Not on file    REVIEW OF SYSTEMS: Unable to perform ROS with patient, details below garnered from the chart and staff Constitutional: Patient's pretty much bedbound ENT:  No nose bleeds Pulm: No reports of cough or shortness of breath CV:  No palpitations, no LE edema.  No reports of chest pain GU:  No hematuria, no frequency.  RN tells me last bowel movement was on 10/5 and that he has not had any rectal bleeding or melena GI: Confusion inhibits patient's ability to take p.o., he is currently n.p.o.  Previously earlier during the admission he was allowed p.o. intake. Heme: Bruises easily. Transfusions:  Per HPI Neuro:  No headaches, no peripheral tingling or numbness Derm:  No itching, no rash or sores.  Endocrine:  No sweats or chills.   Immunization:  Not known Travel:  Unknown but given his multiple admissions to the hospital this year, doubt that he traveled beyond the local region.   PHYSICAL EXAM: Vital signs in last 24 hours: Blood pressure 92/73.  Heart rate 110.  Respirations 14.  Temp 25F 77 kg Wt Readings from Last 3 Encounters:  10/09/11 95.3 kg  07/02/11 92.9 kg   General: Jaundiced, chronically and acutely ill appearing WM who is confused and babbling/groaning at times. Head: No facial asymmetry.   Slight facial edema.  No  signs of head trauma. Eyes: Icteric sclera.  Unable to determine extent of eye movement. Ears: Seems to hear me okay. Nose: No discharge or congestion. Mouth: Did not open mouth for the exam.  Front teeth intact.  No blood on the lips. Neck: No mass, no JVD. Lungs: Clear to auscultation bilaterally in the front.  No labored breathing or cough. Heart: Slightly tachycardic, regular.  S1, S2 present.  No MRG. Abdomen: Soft.  Softball sized swelling in the lower abdomen, added into the left of midline.  Several small to moderate bruises though the fullness is not associated with obvious bruising..   Rectal: Deferred. Musc/Skeltl: No joint redness  or swelling. Extremities: Lower and upper extremity edema.   Neurologic: Not oriented.  Keeps his eyes closed for the most part.  Not following commands.  Delirious/obtunded.  Does move all 4 limbs there is no tremor. Skin: Jaundiced.  Bruising on the limbs, not extensive. Nodes: No cervical adenopathy. Psych: Confused  Intake/Output from previous day: No intake/output data recorded. Intake/Output this shift: No intake/output data recorded.  LAB RESULTS: Recent Labs    12/29/17 0629 12/29/17 1523 12/30/17 0436  WBC 27.3* 25.9* 20.4*  HGB 6.5* 8.8* 8.5*  HCT 21.7* 29.2* 28.0*  PLT 119* 108* 101*   BMET Lab Results  Component Value Date   NA 131 (L) 12/29/2017   NA 128 (L) 12/27/2017   NA 130 (L) 12/25/2017   K 4.1 12/29/2017   K 5.1 12/27/2017   K 4.0 12/25/2017   CL 97 (L) 12/29/2017   CL 94 (L) 12/27/2017   CL 96 (L) 12/25/2017   CO2 23 12/29/2017   CO2 16 (L) 12/27/2017   CO2 20 (L) 12/25/2017   GLUCOSE 98 12/29/2017   GLUCOSE 75 12/27/2017   GLUCOSE 108 (H) 12/25/2017   BUN 42 (H) 12/29/2017   BUN 71 (H) 12/27/2017   BUN 43 (H) 12/25/2017   CREATININE 3.77 (H) 12/29/2017   CREATININE 4.99 (H) 12/27/2017   CREATININE 3.54 (H) 12/25/2017   CALCIUM 7.4 (L) 12/29/2017   CALCIUM 8.3 (L) 12/27/2017   CALCIUM 7.9 (L)  12/25/2017   LFT Recent Labs    12/29/17 0629 12/30/17 0436  PROT  --  4.9*  ALBUMIN 1.9* 1.9*  AST  --  318*  ALT  --  41  ALKPHOS  --  127*  BILITOT  --  13.5*  BILIDIR  --  8.3*  IBILI  --  5.2*   PT/INR Lab Results  Component Value Date   INR 4.37 (HH) 12/30/2017   INR 5.26 (HH) 12/29/2017   INR 6.70 (HH) 12/28/2017   Hepatitis Panel No results for input(s): HEPBSAG, HCVAB, HEPAIGM, HEPBIGM in the last 72 hours. C-Diff No components found for: CDIFF Lipase     Component Value Date/Time   LIPASE 113 (H) 12/30/2017 0436    Drugs of Abuse  No results found for: LABOPIA, COCAINSCRNUR, LABBENZ, AMPHETMU, THCU, LABBARB   RADIOLOGY STUDIES: Dg Chest Port 1 View  Result Date: 12/29/2017 CLINICAL DATA:  Pneumonia EXAM: PORTABLE CHEST 1 VIEW COMPARISON:  Portable exam 0954 hours compared to 12/25/2017 FINDINGS: RIGHT jugular central venous catheter with tip projecting over RIGHT atrium. Enlargement of cardiac silhouette post CABG. Slight pulmonary vascular congestion. Minimal pulmonary edema. Probable small LEFT pleural effusion. No pneumothorax or acute osseous findings. IMPRESSION: Enlargement of cardiac silhouette post CABG with slightly improved pulmonary edema and probable small LEFT pleural effusion. Electronically Signed   By: Lavonia Dana M.D.   On: 12/29/2017 12:46     IMPRESSION:   *    Jaundice .  Gallbladder sludge.  No signs of ductal obstruction. ? Cholecystitis.  No signs of ductal stones or obstruction.  ? alcoholic hepatitis?  No fatty liver or cirrhosis on MRCP.   No evidence for portal vein thrombosis on repeat ultrasounds Hepatitis B immune: immunized vs resolved infection.   HCV Ab pending.    *    Recent heparin induced thrombocytopenia.  On Argatroban.   Coagulopathy, elevated INR improving.    *    11/01/2017 one-vessel CABG and VSD repair.  Repeat VSD repair 12/02/2017. Multiple postoperative complications following both  surgeries as described  above.   PLAN:     *   Labs ordered:   Ammonia level.  Mitochondrial antibodies.  Acute hepatitis panel.  ANA.  Liver kidney microsomal antibody.  IgG.     Azucena Freed  12/30/2017, 4:39 PM Phone [phone]

## 2017-12-31 ENCOUNTER — Other Ambulatory Visit (HOSPITAL_COMMUNITY): Payer: Self-pay

## 2017-12-31 DIAGNOSIS — R748 Abnormal levels of other serum enzymes: Secondary | ICD-10-CM

## 2017-12-31 DIAGNOSIS — R17 Unspecified jaundice: Secondary | ICD-10-CM

## 2017-12-31 LAB — CBC WITH DIFFERENTIAL/PLATELET
ABS IMMATURE GRANULOCYTES: 0.71 10*3/uL — AB (ref 0.00–0.07)
Basophils Absolute: 0 10*3/uL (ref 0.0–0.1)
Basophils Relative: 0 %
Eosinophils Absolute: 0 10*3/uL (ref 0.0–0.5)
Eosinophils Relative: 0 %
HCT: 26.4 % — ABNORMAL LOW (ref 39.0–52.0)
Hemoglobin: 8 g/dL — ABNORMAL LOW (ref 13.0–17.0)
IMMATURE GRANULOCYTES: 3 %
LYMPHS ABS: 0.8 10*3/uL (ref 0.7–4.0)
Lymphocytes Relative: 3 %
MCH: 30.5 pg (ref 26.0–34.0)
MCHC: 30.3 g/dL (ref 30.0–36.0)
MCV: 100.8 fL — ABNORMAL HIGH (ref 80.0–100.0)
MONOS PCT: 2 %
Monocytes Absolute: 0.4 10*3/uL (ref 0.1–1.0)
NEUTROS ABS: 23.1 10*3/uL — AB (ref 1.7–7.7)
NEUTROS PCT: 92 %
PLATELETS: 109 10*3/uL — AB (ref 150–400)
RBC: 2.62 MIL/uL — ABNORMAL LOW (ref 4.22–5.81)
RDW: 28.5 % — AB (ref 11.5–15.5)
WBC: 25.1 10*3/uL — ABNORMAL HIGH (ref 4.0–10.5)
nRBC: 0.4 % — ABNORMAL HIGH (ref 0.0–0.2)

## 2017-12-31 LAB — RENAL FUNCTION PANEL
Albumin: 1.7 g/dL — ABNORMAL LOW (ref 3.5–5.0)
Anion gap: 13 (ref 5–15)
BUN: 41 mg/dL — ABNORMAL HIGH (ref 8–23)
CHLORIDE: 100 mmol/L (ref 98–111)
CO2: 21 mmol/L — AB (ref 22–32)
CREATININE: 3.46 mg/dL — AB (ref 0.61–1.24)
Calcium: 6.9 mg/dL — ABNORMAL LOW (ref 8.9–10.3)
GFR calc non Af Amer: 17 mL/min — ABNORMAL LOW (ref >60)
GFR, EST AFRICAN AMERICAN: 20 mL/min — AB (ref >60)
GLUCOSE: 108 mg/dL — AB (ref 70–99)
Phosphorus: 6.1 mg/dL — ABNORMAL HIGH (ref 2.5–4.6)
Potassium: 4.5 mmol/L (ref 3.5–5.1)
Sodium: 134 mmol/L — ABNORMAL LOW (ref 135–145)

## 2017-12-31 LAB — HEPATIC FUNCTION PANEL
ALBUMIN: 1.7 g/dL — AB (ref 3.5–5.0)
ALK PHOS: 125 U/L (ref 38–126)
ALT: 44 U/L (ref 0–44)
AST: 391 U/L — ABNORMAL HIGH (ref 15–41)
BILIRUBIN TOTAL: 14.3 mg/dL — AB (ref 0.3–1.2)
Bilirubin, Direct: 8.5 mg/dL — ABNORMAL HIGH (ref 0.0–0.2)
Indirect Bilirubin: 5.8 mg/dL — ABNORMAL HIGH (ref 0.3–0.9)
Total Protein: 4.8 g/dL — ABNORMAL LOW (ref 6.5–8.1)

## 2017-12-31 LAB — PROTIME-INR
INR: 3.72
PROTHROMBIN TIME: 36.6 s — AB (ref 11.4–15.2)

## 2017-12-31 NOTE — Progress Notes (Signed)
[facility]  ROUNDING NOTE   Subjective:  Still making very little urine. Resting in bed. Due for another dialysis session today.  Icterus present  Objective:  Vital signs in last 24 hours:  Temperature 97.4 pulse 117 respirations 18 blood pressure 109/65  Physical Exam: General: No acute distress  Head: Normocephalic, atraumatic. Moist oral mucosal membranes  Eyes: Icterus noted  Neck: Supple, trachea midline  Lungs:  Scattered rhonchi, normal effort  Heart: S1S2 no rubs  Abdomen:  Soft, nontender, bowel sounds present  Extremities: 1+ peripheral edema.  Neurologic: Awake, alert, following commands  Skin: Jaundice noted  Access: Right IJ permcath    Basic Metabolic Panel: Recent Labs  Lab 01/10/2018 2204 12/25/17 1305 12/27/17 0940 12/29/17 0629  NA  --  130* 128* 131*  K  --  4.0 5.1 4.1  CL  --  96* 94* 97*  CO2  --  20* 16* 23  GLUCOSE  --  108* 75 98  BUN  --  43* 71* 42*  CREATININE 2.89* 3.54* 4.99* 3.77*  CALCIUM  --  7.9* 8.3* 7.4*  PHOS  --   --  9.2* 6.0*    Liver Function Tests: Recent Labs  Lab 12/25/17 1305 12/27/17 0940 12/27/17 1447 12/29/17 0629 12/30/17 0436  AST 174*  --  289*  --  318*  ALT 23  --  26  --  41  ALKPHOS 107  --  118  --  127*  BILITOT 7.5*  --  10.7*  --  13.5*  PROT 4.9*  --  5.1*  --  4.9*  ALBUMIN 2.0* 2.0* 2.1* 1.9* 1.9*   Recent Labs  Lab 12/27/17 1447 12/29/17 0629 12/30/17 0436  LIPASE 1,256* 204* 113*  AMYLASE 501*  --   --    No results for input(s): AMMONIA in the last 168 hours.  CBC: Recent Labs  Lab 12/26/17 0716 12/27/17 0255 12/27/17 0940 12/28/17 0349 12/29/17 0629 12/29/17 1523 12/30/17 0436  WBC 18.4* 19.6* 20.2* 26.6* 27.3* 25.9* 20.4*  NEUTROABS 14.3* 17.0*  --  24.2* 25.6*  --  19.0*  HGB 7.8* 8.1* 7.3* 7.2* 6.5* 8.8* 8.5*  HCT 26.2* 26.5* 23.4* 23.2* 21.7* 29.2* 28.0*  MCV 111.0* 110.9* 108.8* 109.4* 110.2* 102.5* 100.7*  PLT 156 159 262 235 119* 108* 101*     Cardiac Enzymes: No results for input(s): CKTOTAL, CKMB, CKMBINDEX, TROPONINI in the last 168 hours.  BNP: Invalid input(s): POCBNP  CBG: No results for input(s): GLUCAP in the last 168 hours.  Microbiology: Results for orders placed or performed during the hospital encounter of 01/13/2018  Culture, blood (routine x 2)     Status: None (Preliminary result)   Collection Time: 12/29/17 11:30 AM  Result Value Ref Range Status   Specimen Description BLOOD HEMODIALYSIS CATHETER  Final   Special Requests   Final    BOTTLES DRAWN AEROBIC AND ANAEROBIC Blood Culture adequate volume   Culture   Final    NO GROWTH < 24 HOURS Performed at [facility], [address], [place], [place] [address]    Report Status PENDING  Incomplete  Culture, blood (routine x 2)     Status: None (Preliminary result)   Collection Time: 12/29/17 11:40 AM  Result Value Ref Range Status   Specimen Description BLOOD HEMODIALYSIS CATHETER  Final   Special Requests   Final    BOTTLES DRAWN AEROBIC AND ANAEROBIC Blood Culture adequate volume   Culture   Final  NO GROWTH < 24 HOURS Performed at [facility], [address] [place], [place] [address]    Report Status PENDING  Incomplete    Coagulation Studies: Recent Labs    12/28/17 0915 12/29/17 0629 12/30/17 0445  LABPROT 57.9* 47.9* 41.4*  INR 6.70* 5.26* 4.37*    Urinalysis: No results for input(s): COLORURINE, LABSPEC, PHURINE, GLUCOSEU, HGBUR, BILIRUBINUR, KETONESUR, PROTEINUR, UROBILINOGEN, NITRITE, LEUKOCYTESUR in the last 72 hours.  Invalid input(s): APPERANCEUR    Imaging: Dg Chest Port 1 View  Result Date: 12/29/2017 CLINICAL DATA:  Pneumonia EXAM: PORTABLE CHEST 1 VIEW COMPARISON:  Portable exam 0954 hours compared to 12/25/2017 FINDINGS: RIGHT jugular central venous catheter with tip projecting over RIGHT atrium. Enlargement of cardiac silhouette post CABG. Slight pulmonary vascular congestion. Minimal pulmonary edema.  Probable small LEFT pleural effusion. No pneumothorax or acute osseous findings. IMPRESSION: Enlargement of cardiac silhouette post CABG with slightly improved pulmonary edema and probable small LEFT pleural effusion. Electronically Signed   By: Ulyses Southward M.D.   On: 12/29/2017 12:46     Medications:       Assessment/ Plan:  67 y.o. male with past medical history coronary artery disease status post CABG x 1, VSD repair x2 most recently on December 04, 2017, heparin-induced thrombocytopenia, alcohol abuse, acute kidney injury requiring dialysis, hyperlipidemia who presents to [facility] for ongoing care.   1.  Acute renal failure requiring dialysis. 2.  Heparin-induced thrombocytopenia, catheter packed with citrate.  3.  Hyponatremia. 4.  Anemia unspecified. 5.  Status post CABG x1 and VSD repair x2.  Plan: Acute renal failure still persist.  We plan for hemodialysis today.  Ultrafiltration target 1.5 kg.  We plan to complete dialysis treatment today and next dialysis treatment will be scheduled for Monday.  Continue to pack catheter with citrate given heparin-induced thrombus cytopenia.  Patient continues to have significantly elevated LFTs.  MRCP was attempted however patient could not fully cooperate with the exam.  We will continue to monitor his progress along with you.   LOS: 0 Isaiah Stevenson 10/11/20198:04 AM

## 2018-01-01 LAB — CBC WITH DIFFERENTIAL/PLATELET
ABS IMMATURE GRANULOCYTES: 0.61 10*3/uL — AB (ref 0.00–0.07)
Basophils Absolute: 0 10*3/uL (ref 0.0–0.1)
Basophils Relative: 0 %
Eosinophils Absolute: 0 10*3/uL (ref 0.0–0.5)
Eosinophils Relative: 0 %
HCT: 25.9 % — ABNORMAL LOW (ref 39.0–52.0)
Hemoglobin: 7.8 g/dL — ABNORMAL LOW (ref 13.0–17.0)
IMMATURE GRANULOCYTES: 3 %
LYMPHS ABS: 0.8 10*3/uL (ref 0.7–4.0)
Lymphocytes Relative: 4 %
MCH: 30.2 pg (ref 26.0–34.0)
MCHC: 30.1 g/dL (ref 30.0–36.0)
MCV: 100.4 fL — ABNORMAL HIGH (ref 80.0–100.0)
MONOS PCT: 2 %
Monocytes Absolute: 0.5 10*3/uL (ref 0.1–1.0)
NEUTROS ABS: 20.7 10*3/uL — AB (ref 1.7–7.7)
NEUTROS PCT: 91 %
Platelets: 105 10*3/uL — ABNORMAL LOW (ref 150–400)
RBC: 2.58 MIL/uL — ABNORMAL LOW (ref 4.22–5.81)
RDW: 28.2 % — AB (ref 11.5–15.5)
WBC: 22.7 10*3/uL — ABNORMAL HIGH (ref 4.0–10.5)
nRBC: 0.2 % (ref 0.0–0.2)

## 2018-01-01 LAB — PROTIME-INR
INR: 2.95
Prothrombin Time: 30.5 seconds — ABNORMAL HIGH (ref 11.4–15.2)

## 2018-01-01 LAB — COMPREHENSIVE METABOLIC PANEL
ALT: 58 U/L — AB (ref 0–44)
AST: 420 U/L — ABNORMAL HIGH (ref 15–41)
Albumin: 1.6 g/dL — ABNORMAL LOW (ref 3.5–5.0)
Alkaline Phosphatase: 136 U/L — ABNORMAL HIGH (ref 38–126)
Anion gap: 10 (ref 5–15)
BILIRUBIN TOTAL: 14.1 mg/dL — AB (ref 0.3–1.2)
BUN: 25 mg/dL — ABNORMAL HIGH (ref 8–23)
CALCIUM: 7.1 mg/dL — AB (ref 8.9–10.3)
CHLORIDE: 101 mmol/L (ref 98–111)
CO2: 26 mmol/L (ref 22–32)
Creatinine, Ser: 2.49 mg/dL — ABNORMAL HIGH (ref 0.61–1.24)
GFR, EST AFRICAN AMERICAN: 30 mL/min — AB (ref >60)
GFR, EST NON AFRICAN AMERICAN: 26 mL/min — AB (ref >60)
Glucose, Bld: 109 mg/dL — ABNORMAL HIGH (ref 70–99)
Potassium: 3.8 mmol/L (ref 3.5–5.1)
Sodium: 137 mmol/L (ref 135–145)
TOTAL PROTEIN: 4.6 g/dL — AB (ref 6.5–8.1)

## 2018-01-01 LAB — BILIRUBIN, DIRECT: Bilirubin, Direct: 8.6 mg/dL — ABNORMAL HIGH (ref 0.0–0.2)

## 2018-01-01 LAB — HEPATITIS C ANTIBODY: HCV Ab: <0.1 s/co ratio (ref 0.0–0.9)

## 2018-01-01 LAB — AMMONIA: AMMONIA: 35 umol/L (ref 9–35)

## 2018-01-01 NOTE — Progress Notes (Signed)
Daily Rounding Note  01/01/2018, 11:44 AM  LOS: 0 days   SUBJECTIVE:   Chief complaint: Jaundice.    Remains jaundiced and confused. Nurse familiar with the patient advises to the that his blood pressures run quite low even when not on hemodialysis.  Systolic pressures in the 80s, diastolics in the 50s.  Heart rate running from the 1 teens -130s.  He does not sustain rates in the 130s.    OBJECTIVE:         Vital signs in last 24 hours:    BP 82/53.   Heart rate 111 General: Jaundiced Heart: Tacky, regular. Chest: Clear bilaterally.  No cough or labored breathing. Abdomen: Soft.  Not tender, not distended but sense of fullness to palpation.. Extremities: Edema in the arms and legs.  Pitting in the upper legs/feet. Neuro/Psych: Groans at time.  Not following commands.  He is alert.  No tremors.  Not oriented  Intake/Output from previous day: No intake/output data recorded.  Intake/Output this shift: No intake/output data recorded.  Lab Results: Recent Labs    12/30/17 0436 12/31/17 0732 01/01/18 0601  WBC 20.4* 25.1* 22.7*  HGB 8.5* 8.0* 7.8*  HCT 28.0* 26.4* 25.9*  PLT 101* 109* 105*   BMET Recent Labs    12/31/17 0732 01/01/18 0601  NA 134* 137  K 4.5 3.8  CL 100 101  CO2 21* 26  GLUCOSE 108* 109*  BUN 41* 25*  CREATININE 3.46* 2.49*  CALCIUM 6.9* 7.1*   LFT Recent Labs    12/30/17 0436 12/31/17 0732 01/01/18 0601  PROT 4.9* 4.8* 4.6*  ALBUMIN 1.9* 1.7*  1.7* 1.6*  AST 318* 391* 420*  ALT 41 44 58*  ALKPHOS 127* 125 136*  BILITOT 13.5* 14.3* 14.1*  BILIDIR 8.3* 8.5* 8.6*  IBILI 5.2* 5.8*  --    PT/INR Recent Labs    12/31/17 0732 01/01/18 0601  LABPROT 36.6* 30.5*  INR 3.72 2.95   Hepatitis Panel Recent Labs    12/31/17 0732  HCVAB <0.1    Studies/Results: Mr Abdomen Mrcp Wo Contrast  Result Date: 12/31/2017 CLINICAL DATA:  Cholelithiasis.  Jaundice. EXAM: MRI ABDOMEN  WITHOUT CONTRAST  (INCLUDING MRCP) TECHNIQUE: Multiplanar multisequence MR imaging of the abdomen was performed. Heavily T2-weighted images of the biliary and pancreatic ducts were obtained, and three-dimensional MRCP images were rendered by post processing. COMPARISON:  Ultrasound 12/27/2017 FINDINGS: Lower chest:  Bilateral pleural effusions. Hepatobiliary: No intrahepatic duct dilatation. Gallbladder is distended to 4.6 cm there is no extrahepatic biliary duct dilatation. The common bile duct is barely visible measuring 2 mm in diameter. No discrete stone seen within the gallbladder. There is some sludge dependent in the neck of the gallbladder. No hepatic steatosis.  No focal hepatic parenchymal lesion. Pancreas: No pancreatic inflammation or ductal dilatation. Spleen: Normal volume spleen Adrenals/urinary tract: Adrenal glands and kidneys are normal. Stomach/Bowel: Stomach and limited of the small bowel is unremarkable Vascular/Lymphatic: Abdominal aortic normal caliber. No retroperitoneal periportal lymphadenopathy. Musculoskeletal: No aggressive osseous lesion IMPRESSION: 1. No biliary obstruction.  Common bile duct normal caliber. 2. Gallbladder mildly distended.  Sludge in the gallbladder. 3. No focal hepatic lesion. No hepatic steatosis. 4. No pancreatic inflammation 5. Bilateral pleural effusions. Electronically Signed   By: Genevive Bi M.D.   On: 12/31/2017 10:17   Dg Abd Portable 1v  Result Date: 12/31/2017 CLINICAL DATA:  NG tube placement. EXAM: PORTABLE ABDOMEN - 1 VIEW COMPARISON:  One-view chest  x-ray 12/29/2017. FINDINGS: NG tube terminates in the fundus the stomach. Bowel gas pattern is normal. Dialysis catheter terminates in the right atrium. IMPRESSION: NG tube terminates in the fundus of the stomach. Electronically Signed   By: Marin Roberts M.D.   On: 12/31/2017 17:07    ASSESMENT:   *   Recurrent, acute jaundice. [place] with GB sludge, upper limits normal liver echo.  .     Suspect multifactorial including possible passage of common bile duct stone, chronic alcohol abuse, ischemic hepatopathy, medications. LFTs worsening.   Pending labs include acute hepatitis panel, smooth muscle antibody, mitochondrial antibody, liver kidney microsomal antibodies, IgG, ANA Given the sustained hypotension, shock liver/ischemic hepatopathy seems most likely.  He is not on any blood pressure meds.  *    AMS.  Ammonia level normal.  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    *   ESRD.  On HD.    *     Macrocytic anemia.  PLAN   *  Supportive care.  Await serolgies, automimmune markers.      Jennye Moccasin  01/01/2018, 11:44 AM Phone [phone]

## 2018-01-02 ENCOUNTER — Other Ambulatory Visit (HOSPITAL_COMMUNITY): Payer: Self-pay

## 2018-01-02 LAB — CBC WITH DIFFERENTIAL/PLATELET
Abs Immature Granulocytes: 0.7 10*3/uL — ABNORMAL HIGH (ref 0.00–0.07)
Basophils Absolute: 0 10*3/uL (ref 0.0–0.1)
Basophils Relative: 0 %
EOS ABS: 0 10*3/uL (ref 0.0–0.5)
EOS PCT: 0 %
HCT: 23.3 % — ABNORMAL LOW (ref 39.0–52.0)
Hemoglobin: 7.2 g/dL — ABNORMAL LOW (ref 13.0–17.0)
Immature Granulocytes: 3 %
LYMPHS ABS: 0.8 10*3/uL (ref 0.7–4.0)
Lymphocytes Relative: 4 %
MCH: 31.4 pg (ref 26.0–34.0)
MCHC: 30.9 g/dL (ref 30.0–36.0)
MCV: 101.7 fL — AB (ref 80.0–100.0)
MONOS PCT: 2 %
Monocytes Absolute: 0.4 10*3/uL (ref 0.1–1.0)
Neutro Abs: 21.2 10*3/uL — ABNORMAL HIGH (ref 1.7–7.7)
Neutrophils Relative %: 91 %
Platelets: 96 10*3/uL — ABNORMAL LOW (ref 150–400)
RBC: 2.29 MIL/uL — ABNORMAL LOW (ref 4.22–5.81)
RDW: 28.1 % — ABNORMAL HIGH (ref 11.5–15.5)
WBC: 23.2 10*3/uL — ABNORMAL HIGH (ref 4.0–10.5)
nRBC: 0.1 % (ref 0.0–0.2)

## 2018-01-02 LAB — BLOOD GAS, ARTERIAL
Acid-base deficit: 0.5 mmol/L (ref 0.0–2.0)
BICARBONATE: 23.6 mmol/L (ref 20.0–28.0)
O2 CONTENT: 4 L/min
O2 SAT: 84.6 %
Patient temperature: 98.6
pCO2 arterial: 38 mmHg (ref 32.0–48.0)
pH, Arterial: 7.409 (ref 7.350–7.450)
pO2, Arterial: 51.5 mmHg — ABNORMAL LOW (ref 83.0–108.0)

## 2018-01-02 LAB — PROTIME-INR
INR: 2.94
PROTHROMBIN TIME: 30.4 s — AB (ref 11.4–15.2)

## 2018-01-02 LAB — ANTI-SMOOTH MUSCLE ANTIBODY, IGG: F-Actin IgG: 17 Units (ref 0–19)

## 2018-01-02 LAB — IGG: IGG (IMMUNOGLOBIN G), SERUM: 825 mg/dL (ref 700–1600)

## 2018-01-02 LAB — MITOCHONDRIAL ANTIBODIES: Mitochondrial M2 Ab, IgG: <20 Units (ref 0.0–20.0)

## 2018-01-02 LAB — ANTI-MICROSOMAL ANTIBODY LIVER / KIDNEY: LKM1 AB: 0.9 U (ref 0.0–20.0)

## 2018-01-03 LAB — ANTINUCLEAR ANTIBODIES, IFA: ANTINUCLEAR ANTIBODIES, IFA: NEGATIVE

## 2018-01-04 LAB — HEPATITIS PANEL, ACUTE
HCV Ab: <0.1 s/co ratio (ref 0.0–0.9)
HEP A IGM: NEGATIVE
HEP B C IGM: NEGATIVE
Hepatitis B Surface Ag: NEGATIVE

## 2018-01-04 LAB — CULTURE, BLOOD (ROUTINE X 2)
CULTURE: NO GROWTH
Culture: NO GROWTH
SPECIAL REQUESTS: ADEQUATE
Special Requests: ADEQUATE

## 2018-01-21 NOTE — Progress Notes (Signed)
I spoke with wife and brother in the room this am. Patient was made comfort care.   Willette Cluster, NP

## 2018-01-21 NOTE — Progress Notes (Signed)
[facility]  ROUNDING NOTE   Subjective:  Patient with significant decline over the weekend. Still has icterus. Gastroenterology did evaluate the patient. It appears that the family is opted for comfort care. Does appear to have labored breathing at the moment.  Objective:  Vital signs in last 24 hours:  Temperature 97.6 pulse 102 respirations 15 blood pressure 93/49  Physical Exam: General: Mildly labored breathing  Head: Normocephalic, atraumatic. Moist oral mucosal membranes  Eyes: Icterus noted  Neck: Supple, trachea midline  Lungs:  Scattered rhonchi, mildly labored breathing  Heart: S1S2 no rubs  Abdomen:  Soft, nontender, bowel sounds present  Extremities: 1+ peripheral edema.  Neurologic: Lethargic difficult to arouse  Skin: Jaundice noted  Access: Right IJ permcath    Basic Metabolic Panel: Recent Labs  Lab 12/27/17 0940 12/29/17 0629 12/31/17 0732 01/01/18 0601  NA 128* 131* 134* 137  K 5.1 4.1 4.5 3.8  CL 94* 97* 100 101  CO2 16* 23 21* 26  GLUCOSE 75 98 108* 109*  BUN 71* 42* 41* 25*  CREATININE 4.99* 3.77* 3.46* 2.49*  CALCIUM 8.3* 7.4* 6.9* 7.1*  PHOS 9.2* 6.0* 6.1*  --     Liver Function Tests: Recent Labs  Lab 12/27/17 1447 12/29/17 0629 12/30/17 0436 12/31/17 0732 01/01/18 0601  AST 289*  --  318* 391* 420*  ALT 26  --  41 44 58*  ALKPHOS 118  --  127* 125 136*  BILITOT 10.7*  --  13.5* 14.3* 14.1*  PROT 5.1*  --  4.9* 4.8* 4.6*  ALBUMIN 2.1* 1.9* 1.9* 1.7*  1.7* 1.6*   Recent Labs  Lab 12/27/17 1447 12/29/17 0629 12/30/17 0436  LIPASE 1,256* 204* 113*  AMYLASE 501*  --   --    Recent Labs  Lab 01/01/18 0601  AMMONIA 35    CBC: Recent Labs  Lab 12/29/17 0629 12/29/17 1523 12/30/17 0436 12/31/17 0732 01/01/18 0601 01/02/18 0522  WBC 27.3* 25.9* 20.4* 25.1* 22.7* 23.2*  NEUTROABS 25.6*  --  19.0* 23.1* 20.7* 21.2*  HGB 6.5* 8.8* 8.5* 8.0* 7.8* 7.2*  HCT 21.7* 29.2* 28.0* 26.4* 25.9* 23.3*  MCV 110.2*  102.5* 100.7* 100.8* 100.4* 101.7*  PLT 119* 108* 101* 109* 105* 96*    Cardiac Enzymes: No results for input(s): CKTOTAL, CKMB, CKMBINDEX, TROPONINI in the last 168 hours.  BNP: Invalid input(s): POCBNP  CBG: No results for input(s): GLUCAP in the last 168 hours.  Microbiology: Results for orders placed or performed during the hospital encounter of 01-17-2018  Culture, blood (routine x 2)     Status: None (Preliminary result)   Collection Time: 12/29/17 11:30 AM  Result Value Ref Range Status   Specimen Description BLOOD HEMODIALYSIS CATHETER  Final   Special Requests   Final    BOTTLES DRAWN AEROBIC AND ANAEROBIC Blood Culture adequate volume   Culture   Final    NO GROWTH 4 DAYS Performed at [facility], [address], [place], [place] [address]    Report Status PENDING  Incomplete  Culture, blood (routine x 2)     Status: None (Preliminary result)   Collection Time: 12/29/17 11:40 AM  Result Value Ref Range Status   Specimen Description BLOOD HEMODIALYSIS CATHETER  Final   Special Requests   Final    BOTTLES DRAWN AEROBIC AND ANAEROBIC Blood Culture adequate volume   Culture   Final    NO GROWTH 4 DAYS Performed at [facility], [address]  [place], [place] [address]    Report Status PENDING  Incomplete    Coagulation Studies: Recent Labs    01/01/18 0601 01/02/18 0522  LABPROT 30.5* 30.4*  INR 2.95 2.94    Urinalysis: No results for input(s): COLORURINE, LABSPEC, PHURINE, GLUCOSEU, HGBUR, BILIRUBINUR, KETONESUR, PROTEINUR, UROBILINOGEN, NITRITE, LEUKOCYTESUR in the last 72 hours.  Invalid input(s): APPERANCEUR    Imaging: Dg Chest Port 1 View  Result Date: 01/02/2018 CLINICAL DATA:  Oxygen desaturation. EXAM: PORTABLE CHEST 1 VIEW COMPARISON:  12/29/2017 FINDINGS: Nasogastric tube is looped in the stomach with tip at proximal stomach. A dialysis catheter terminates at the low right atrium. Patient rotated right. Cardiomegaly accentuated  by AP portable technique. Probable layering left pleural effusion. No pneumothorax. Worsened aeration with increased interstitial and airspace disease bilaterally. Slightly greater on the left. Relatively diffuse. IMPRESSION: Significantly worsened aeration, likely due to progressive interstitial and developing alveolar edema. Probable layering left pleural effusion. Electronically Signed   By: Jeronimo Greaves M.D.   On: 01/02/2018 13:11     Medications:       Assessment/ Plan:  67 y.o. male with past medical history coronary artery disease status post CABG x 1, VSD repair x2 most recently on December 04, 2017, heparin-induced thrombocytopenia, alcohol abuse, acute kidney injury requiring dialysis, hyperlipidemia who presents to [facility] now for ongoing care.   1.  Acute renal failure requiring dialysis. 2.  Heparin-induced thrombocytopenia, catheter packed with citrate.  3.  Hyponatremia. 4.  Anemia unspecified. 5.  Status post CABG x1 and VSD repair x2.   Plan: Patient with significant deterioration over the weekend.  He continues to have acute renal failure as well as liver dysfunction.  Given his decline the family has opted for comfort care which certainly appears to be reasonable given the patient's extensive hospital course.  No further dialysis to be performed.  He does have mildly labored breathing and is on a fentanyl drip at the moment.  Titration of this as per hospitalist.  Thanks for allowing [place] to participate.  We will sign off.  LOS: 0 Isaiah Stevenson 10/29/20198:41 AM

## 2018-01-21 DEATH — deceased

## 2019-03-21 IMAGING — US US ABDOMEN LIMITED
1 series · 14 of 25 positions shown · non-contrast
Comparison: None

CLINICAL DATA: Jaundice

EXAM:
ULTRASOUND ABDOMEN LIMITED RIGHT UPPER QUADRANT

[Series 1: us abdomen limited · 0.22mm/px · 14 of 40 slices shown]
[im 1/40]
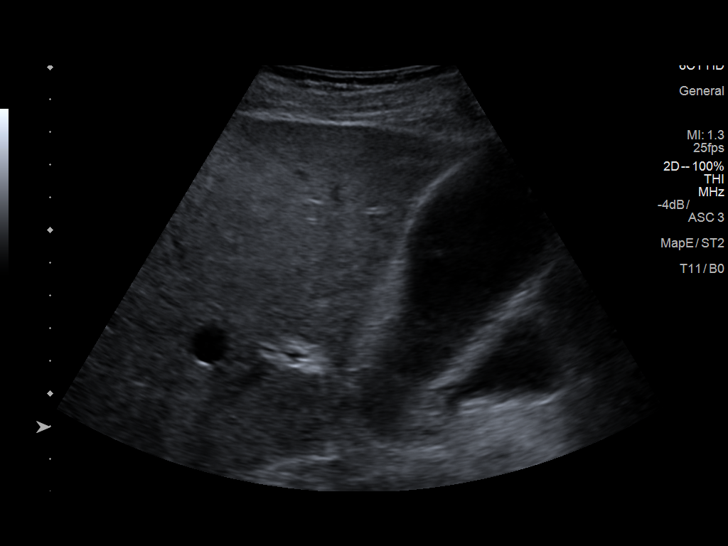
[im 4/40]
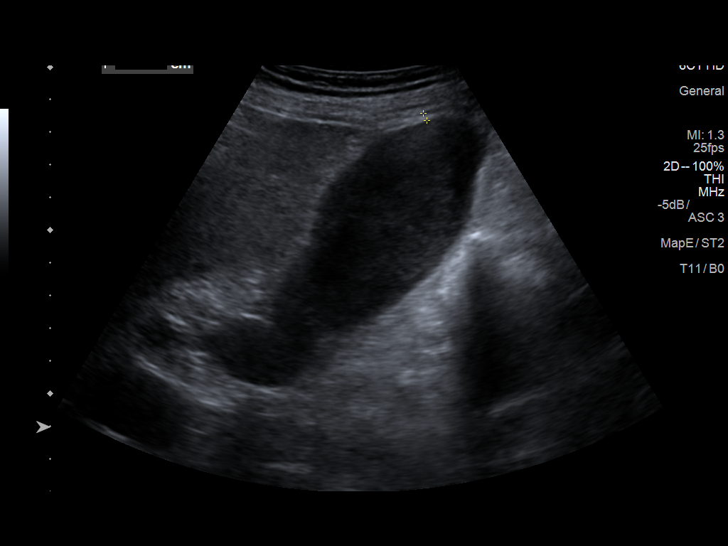
[im 7/40]
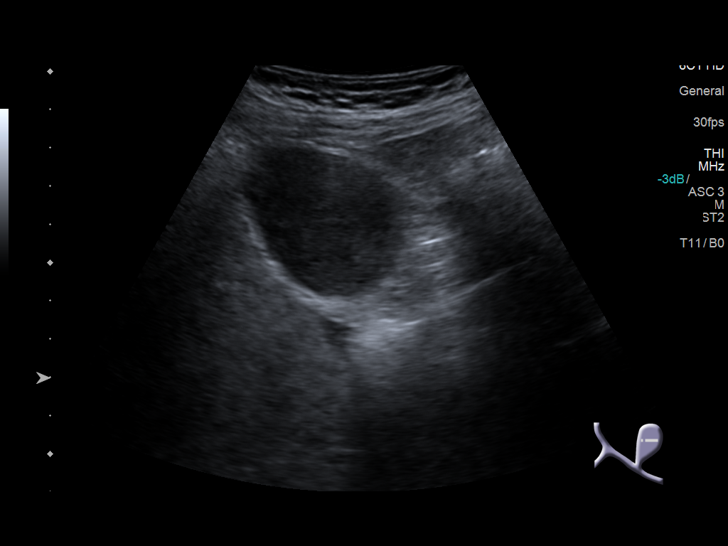
[im 10/40]
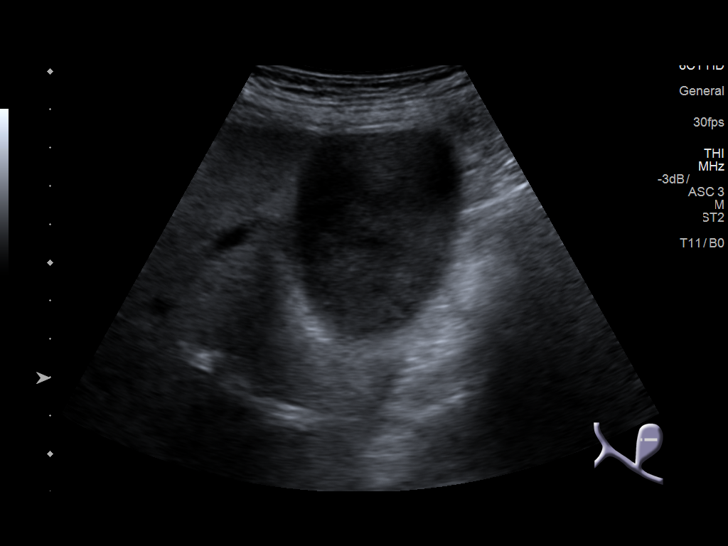
[im 14/40]
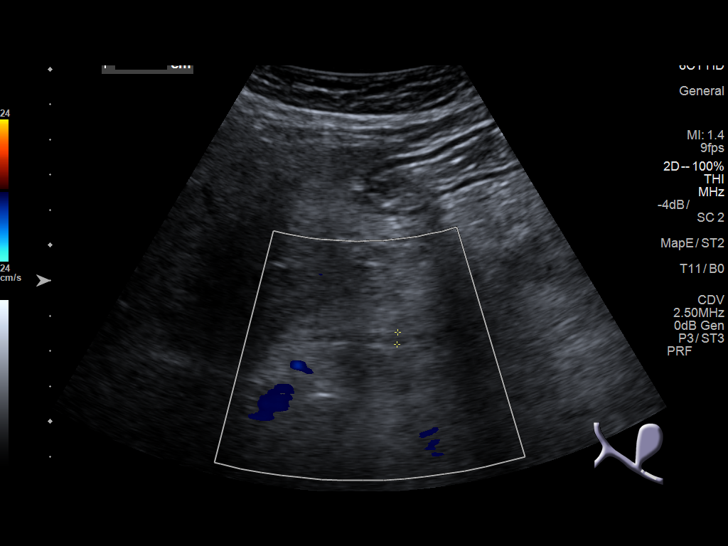
[im 15/40]
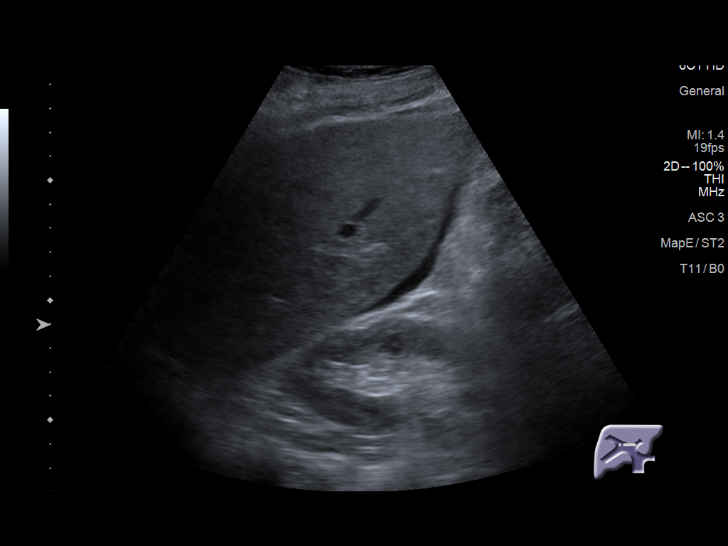
[im 18/40]
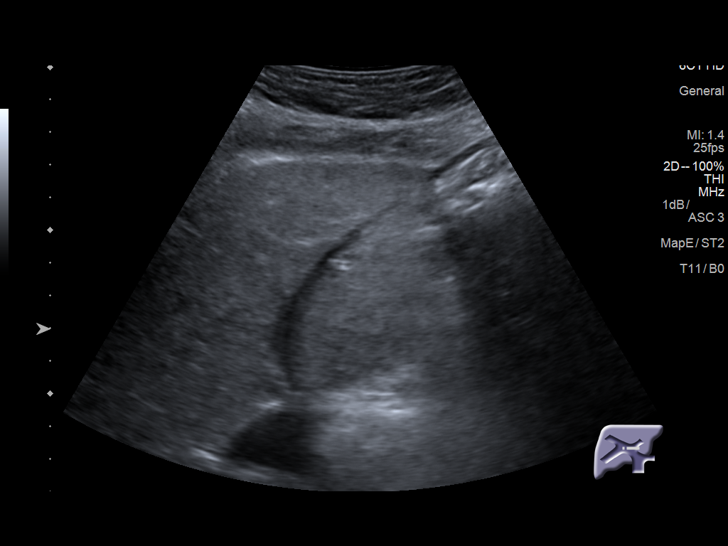
[im 22/40]
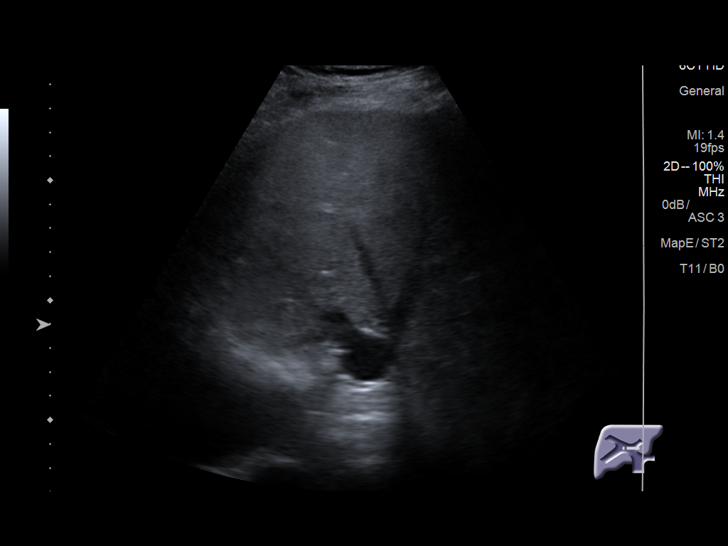
[im 25/40]
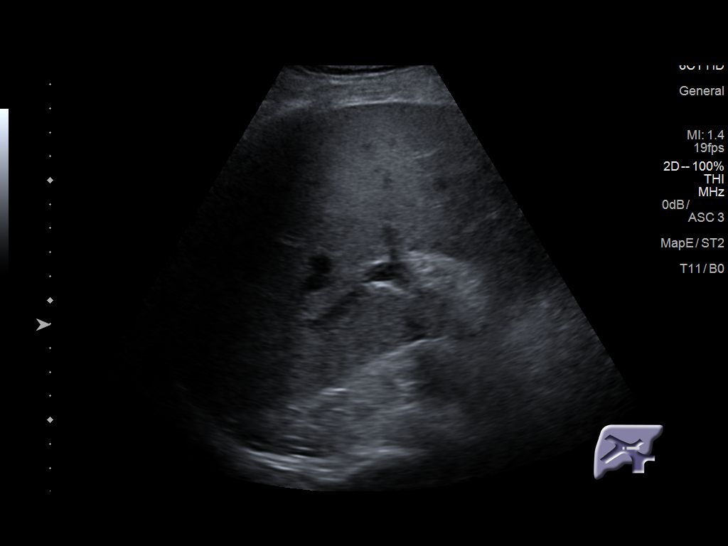
[im 27/40]
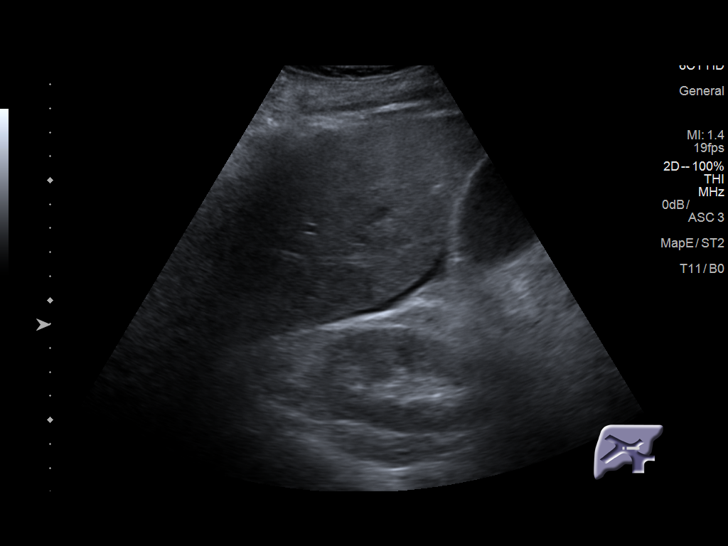
[im 30/40]
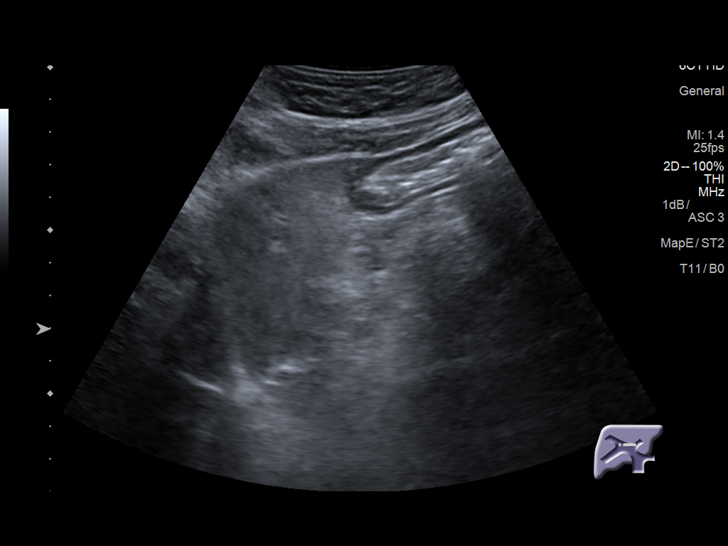
[im 33/40]
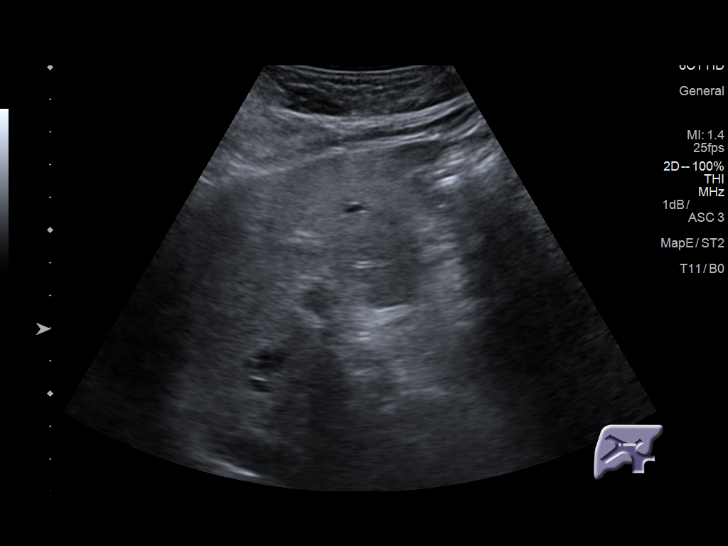
[im 36/40]
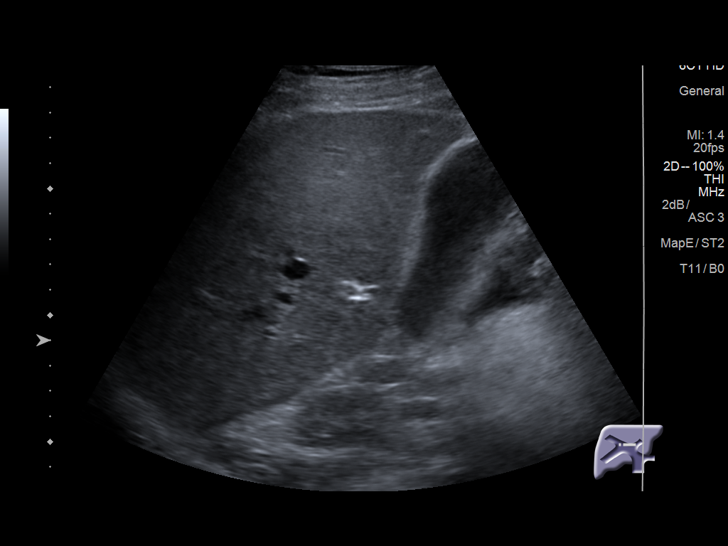
[im 40/40]
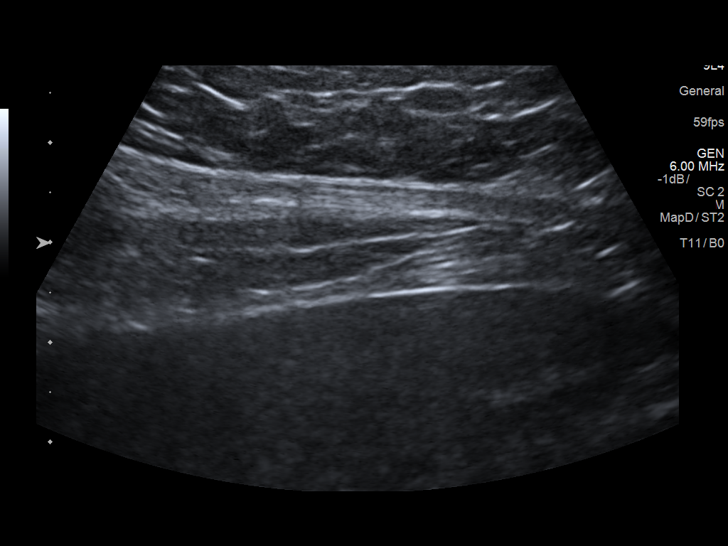

[14 of 25 positions shown; findings below may reference images not displayed]

FINDINGS: Gallbladder:

Distended gallbladder filled with sludge. No definite shadowing
calculi, wall thickening, or pericholecystic fluid. Unable to assess
for presence of a sonographic Murphy sign due to prior pain
medication.

Common bile duct:

Diameter: 3 mm diameter, normal

Liver:

Upper normal echogenicity. No focal mass or nodularity. Portal vein
is patent on color Doppler imaging with normal direction of blood
flow towards the liver.

Minimal perihepatic free fluid.

Incidentally noted RIGHT pleural effusion.
IMPRESSION: Distended gallbladder filled with sludge but no definite shadowing
calculi.

Minimal perihepatic ascites and noted RIGHT pleural effusion.

## 2019-12-12 IMAGING — DX DG CHEST 1V PORT
1 series · 1 of 1 positions shown · non-contrast
Comparison: Portable exam 9351 hours compared to 12/25/2017

CLINICAL DATA: Pneumonia

EXAM:
PORTABLE CHEST 1 VIEW

[chest]
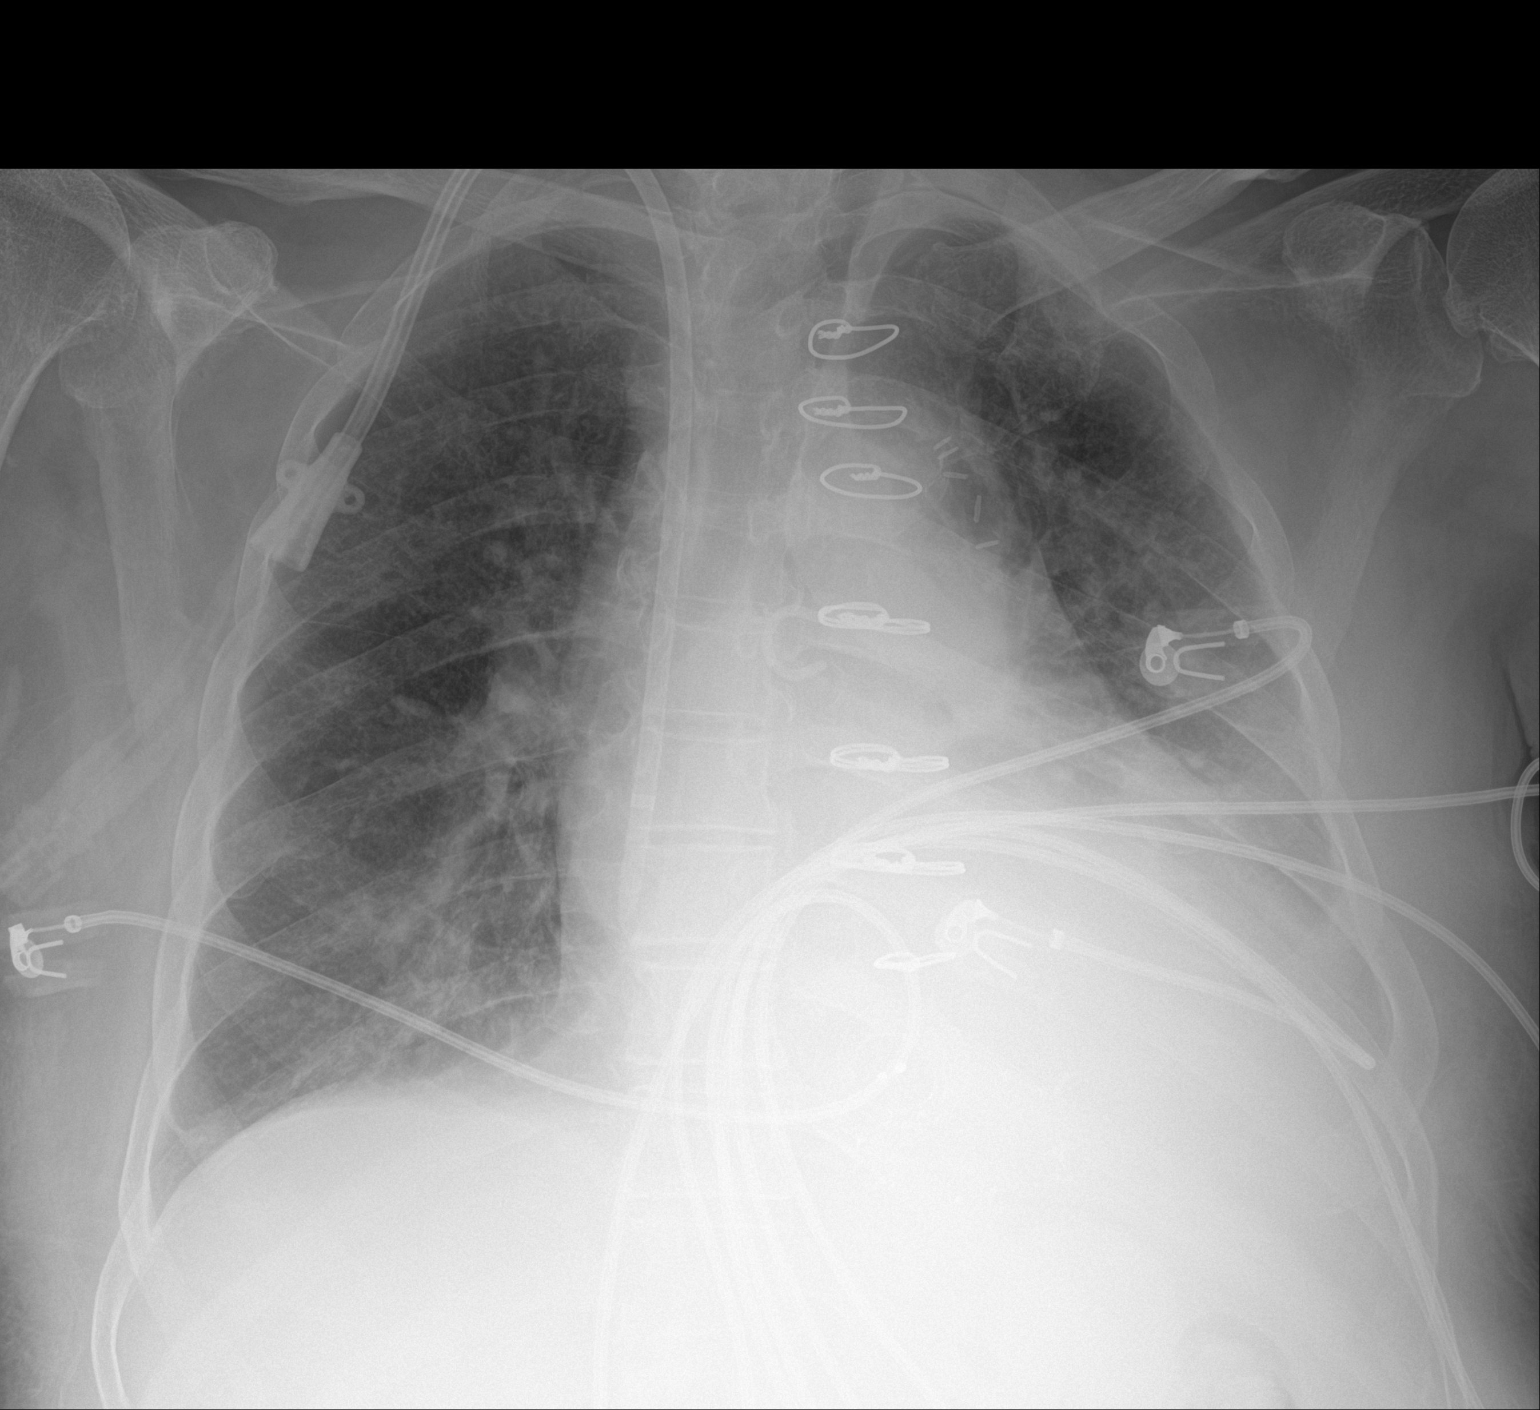

[1 of 1 positions shown; findings below may reference images not displayed]

FINDINGS: RIGHT jugular central venous catheter with tip projecting over RIGHT
atrium.

Enlargement of cardiac silhouette post CABG.

Slight pulmonary vascular congestion.

Minimal pulmonary edema.

Probable small LEFT pleural effusion.

No pneumothorax or acute osseous findings.
IMPRESSION: Enlargement of cardiac silhouette post CABG with slightly improved
pulmonary edema and probable small LEFT pleural effusion.

## 2019-12-16 IMAGING — DX DG CHEST 1V PORT
1 series · 1 of 1 positions shown · non-contrast
Comparison: 12/29/2017

CLINICAL DATA: Oxygen desaturation.

EXAM:
PORTABLE CHEST 1 VIEW

[chest ap]
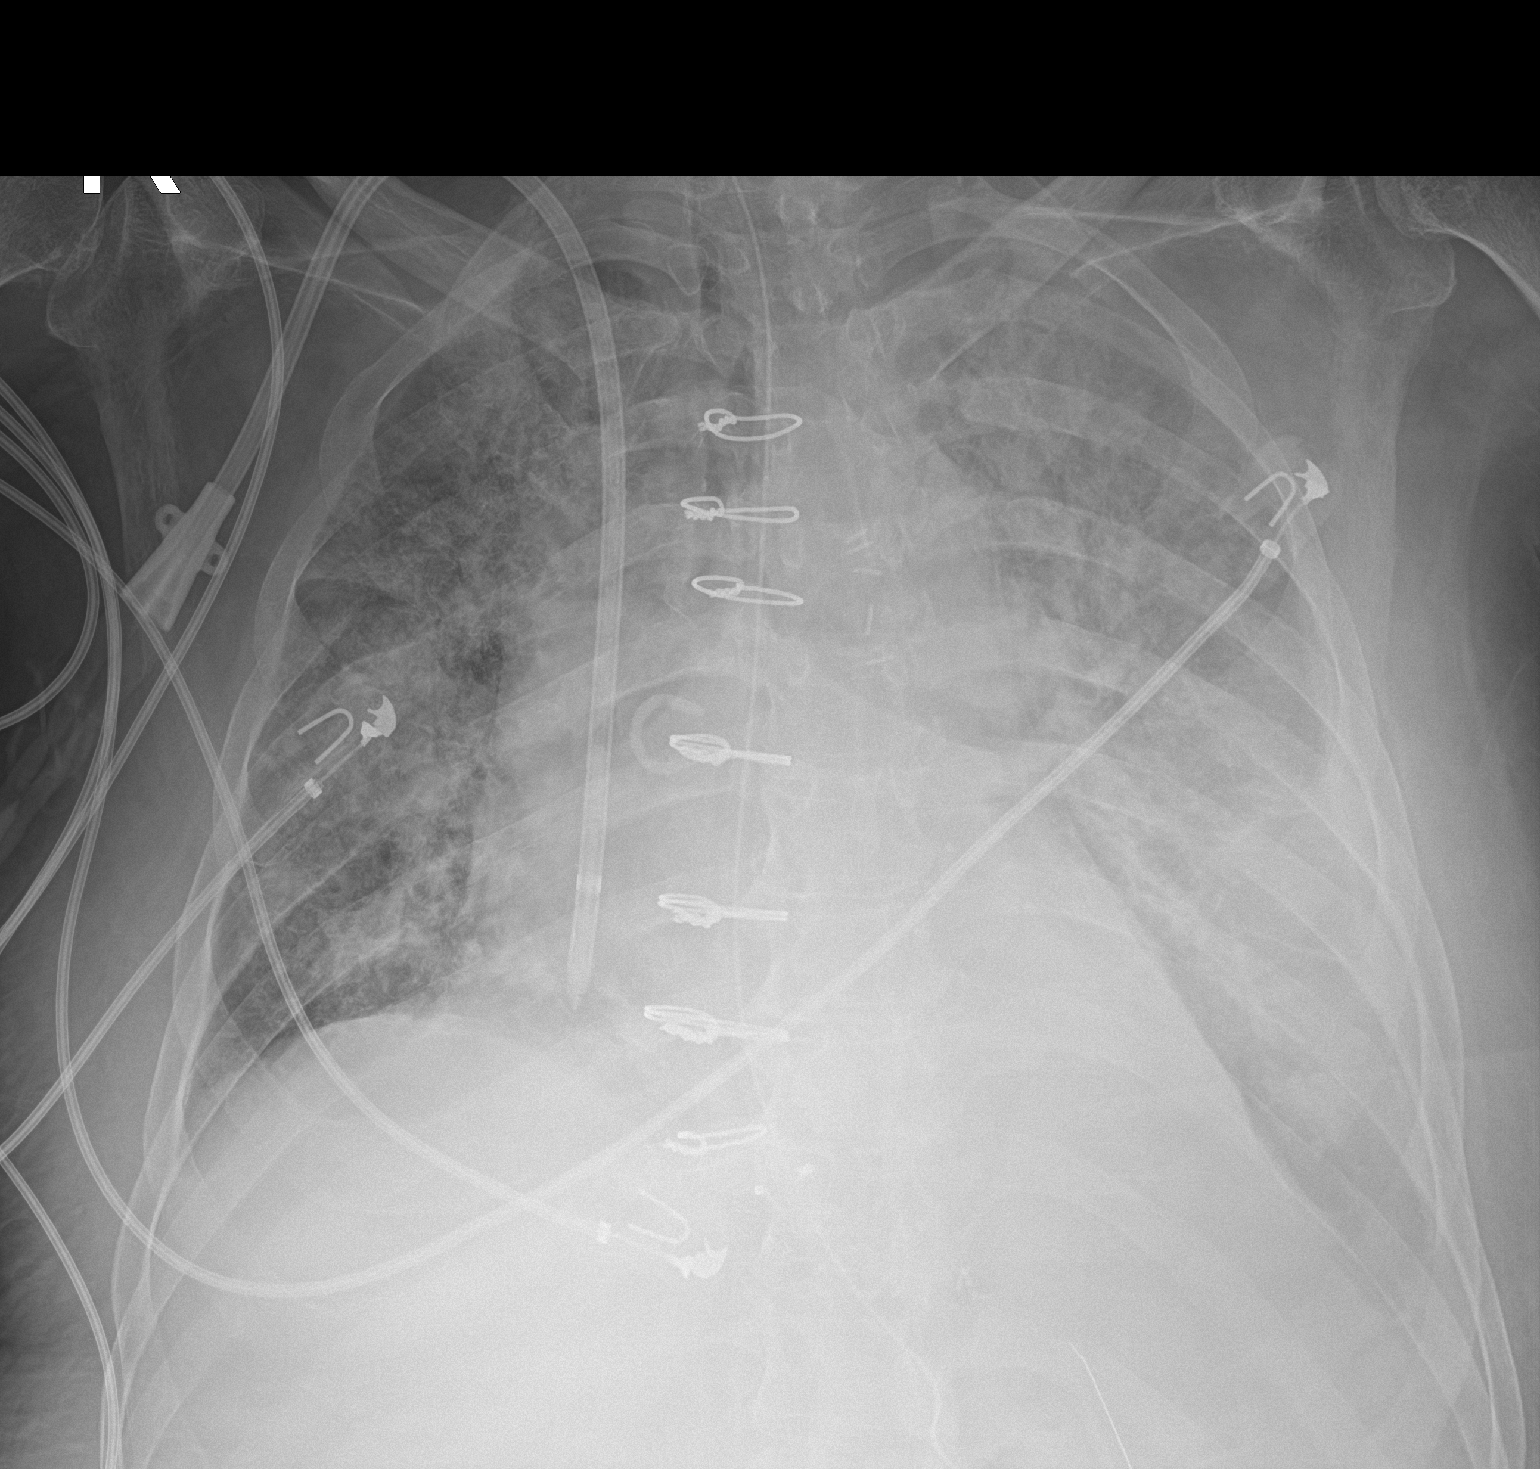

[1 of 1 positions shown; findings below may reference images not displayed]

FINDINGS: Nasogastric tube is looped in the stomach with tip at proximal
stomach. A dialysis catheter terminates at the low right atrium.
Patient rotated right. Cardiomegaly accentuated by AP portable
technique. Probable layering left pleural effusion. No pneumothorax.
Worsened aeration with increased interstitial and airspace disease
bilaterally. Slightly greater on the left. Relatively diffuse.
IMPRESSION: Significantly worsened aeration, likely due to progressive
interstitial and developing alveolar edema. Probable layering left
pleural effusion.
